# Patient Record
Sex: Female | Born: 1960 | Race: White | Hispanic: No | Marital: Married | Smoking: Never smoker
Health system: Southern US, Community
[De-identification: ages and names within clinical notes are randomized; demographics above are authoritative.]

## PROBLEM LIST (undated history)

## (undated) DIAGNOSIS — R609 Edema, unspecified: Secondary | ICD-10-CM

## (undated) DIAGNOSIS — N189 Chronic kidney disease, unspecified: Secondary | ICD-10-CM

## (undated) DIAGNOSIS — F419 Anxiety disorder, unspecified: Secondary | ICD-10-CM

## (undated) DIAGNOSIS — F329 Major depressive disorder, single episode, unspecified: Secondary | ICD-10-CM

## (undated) DIAGNOSIS — F32A Depression, unspecified: Secondary | ICD-10-CM

## (undated) DIAGNOSIS — R51 Headache: Secondary | ICD-10-CM

## (undated) DIAGNOSIS — R42 Dizziness and giddiness: Secondary | ICD-10-CM

## (undated) DIAGNOSIS — Z9109 Other allergy status, other than to drugs and biological substances: Secondary | ICD-10-CM

## (undated) DIAGNOSIS — I10 Essential (primary) hypertension: Secondary | ICD-10-CM

## (undated) DIAGNOSIS — G4730 Sleep apnea, unspecified: Secondary | ICD-10-CM

## (undated) DIAGNOSIS — R112 Nausea with vomiting, unspecified: Secondary | ICD-10-CM

## (undated) DIAGNOSIS — E039 Hypothyroidism, unspecified: Secondary | ICD-10-CM

## (undated) DIAGNOSIS — K219 Gastro-esophageal reflux disease without esophagitis: Secondary | ICD-10-CM

## (undated) DIAGNOSIS — Z9889 Other specified postprocedural states: Secondary | ICD-10-CM

## (undated) DIAGNOSIS — J40 Bronchitis, not specified as acute or chronic: Secondary | ICD-10-CM

## (undated) DIAGNOSIS — G43909 Migraine, unspecified, not intractable, without status migrainosus: Secondary | ICD-10-CM

## (undated) DIAGNOSIS — E119 Type 2 diabetes mellitus without complications: Secondary | ICD-10-CM

## (undated) DIAGNOSIS — R600 Localized edema: Secondary | ICD-10-CM

## (undated) DIAGNOSIS — E785 Hyperlipidemia, unspecified: Secondary | ICD-10-CM

## (undated) DIAGNOSIS — D649 Anemia, unspecified: Secondary | ICD-10-CM

## (undated) HISTORY — PX: CHOLECYSTECTOMY: SHX55

## (undated) HISTORY — DX: Dizziness and giddiness: R42

## (undated) HISTORY — PX: CARPAL TUNNEL RELEASE: SHX101

## (undated) HISTORY — PX: ABDOMINAL HYSTERECTOMY: SHX81

## (undated) HISTORY — DX: Anemia, unspecified: D64.9

## (undated) HISTORY — PX: ROTATOR CUFF REPAIR: SHX139

## (undated) HISTORY — PX: QUADRICEPS REPAIR: SHX2281

## (undated) HISTORY — DX: Migraine, unspecified, not intractable, without status migrainosus: G43.909

---

## 1997-07-14 ENCOUNTER — Other Ambulatory Visit: Admission: RE | Admit: 1997-07-14 | Discharge: 1997-07-14 | Payer: Self-pay | Admitting: Obstetrics & Gynecology

## 1998-07-04 ENCOUNTER — Other Ambulatory Visit: Admission: RE | Admit: 1998-07-04 | Discharge: 1998-07-04 | Payer: Self-pay | Admitting: Obstetrics & Gynecology

## 1999-08-05 ENCOUNTER — Other Ambulatory Visit: Admission: RE | Admit: 1999-08-05 | Discharge: 1999-08-05 | Payer: Self-pay | Admitting: Obstetrics and Gynecology

## 2000-10-29 ENCOUNTER — Other Ambulatory Visit: Admission: RE | Admit: 2000-10-29 | Discharge: 2000-10-29 | Payer: Self-pay | Admitting: Obstetrics and Gynecology

## 2001-11-02 ENCOUNTER — Other Ambulatory Visit: Admission: RE | Admit: 2001-11-02 | Discharge: 2001-11-02 | Payer: Self-pay | Admitting: Obstetrics and Gynecology

## 2001-12-10 ENCOUNTER — Other Ambulatory Visit: Admission: RE | Admit: 2001-12-10 | Discharge: 2001-12-10 | Payer: Self-pay | Admitting: Obstetrics and Gynecology

## 2002-11-25 ENCOUNTER — Other Ambulatory Visit: Admission: RE | Admit: 2002-11-25 | Discharge: 2002-11-25 | Payer: Self-pay | Admitting: Obstetrics and Gynecology

## 2004-01-16 ENCOUNTER — Other Ambulatory Visit: Admission: RE | Admit: 2004-01-16 | Discharge: 2004-01-16 | Payer: Self-pay | Admitting: Obstetrics and Gynecology

## 2005-03-06 ENCOUNTER — Other Ambulatory Visit: Admission: RE | Admit: 2005-03-06 | Discharge: 2005-03-06 | Payer: Self-pay | Admitting: Obstetrics and Gynecology

## 2009-05-26 HISTORY — PX: THUMB ARTHROSCOPY: SHX2509

## 2011-07-14 ENCOUNTER — Other Ambulatory Visit: Payer: Self-pay | Admitting: Orthopedic Surgery

## 2011-07-28 ENCOUNTER — Encounter (HOSPITAL_COMMUNITY): Payer: Self-pay | Admitting: Pharmacy Technician

## 2011-08-04 ENCOUNTER — Other Ambulatory Visit: Payer: Self-pay

## 2011-08-04 ENCOUNTER — Encounter (HOSPITAL_COMMUNITY): Payer: Self-pay

## 2011-08-04 ENCOUNTER — Encounter (HOSPITAL_COMMUNITY)
Admission: RE | Admit: 2011-08-04 | Discharge: 2011-08-04 | Disposition: A | Discharge status: Home / Self Care | Payer: BC Managed Care – PPO | Source: Ambulatory Visit | Attending: Orthopedic Surgery | Admitting: Orthopedic Surgery

## 2011-08-04 HISTORY — DX: Depression, unspecified: F32.A

## 2011-08-04 HISTORY — DX: Essential (primary) hypertension: I10

## 2011-08-04 HISTORY — DX: Headache: R51

## 2011-08-04 HISTORY — DX: Hyperlipidemia, unspecified: E78.5

## 2011-08-04 HISTORY — DX: Other specified postprocedural states: R11.2

## 2011-08-04 HISTORY — DX: Other specified postprocedural states: Z98.890

## 2011-08-04 HISTORY — DX: Major depressive disorder, single episode, unspecified: F32.9

## 2011-08-04 HISTORY — DX: Chronic kidney disease, unspecified: N18.9

## 2011-08-04 HISTORY — DX: Gastro-esophageal reflux disease without esophagitis: K21.9

## 2011-08-04 HISTORY — DX: Hypothyroidism, unspecified: E03.9

## 2011-08-04 HISTORY — DX: Bronchitis, not specified as acute or chronic: J40

## 2011-08-04 LAB — PROTIME-INR: INR: 0.99 (ref 0.00–1.49)

## 2011-08-04 LAB — SURGICAL PCR SCREEN: MRSA, PCR: NEGATIVE

## 2011-08-04 LAB — URINALYSIS, ROUTINE W REFLEX MICROSCOPIC
Glucose, UA: NEGATIVE mg/dL
Hgb urine dipstick: NEGATIVE
Ketones, ur: NEGATIVE mg/dL
Protein, ur: NEGATIVE mg/dL
pH: 6 (ref 5.0–8.0)

## 2011-08-04 LAB — BASIC METABOLIC PANEL
BUN: 18 mg/dL (ref 6–23)
Calcium: 9.7 mg/dL (ref 8.4–10.5)
Creatinine, Ser: 1.09 mg/dL (ref 0.50–1.10)
GFR calc Af Amer: 66 mL/min — ABNORMAL LOW (ref >90)
GFR calc non Af Amer: 57 mL/min — ABNORMAL LOW (ref >90)

## 2011-08-04 LAB — URINE MICROSCOPIC-ADD ON

## 2011-08-04 LAB — DIFFERENTIAL
Basophils Absolute: 0.1 10*3/uL (ref 0.0–0.1)
Basophils Relative: 1 % (ref 0–1)
Neutro Abs: 3.6 10*3/uL (ref 1.7–7.7)
Neutrophils Relative %: 51 % (ref 43–77)

## 2011-08-04 LAB — CBC
MCHC: 33 g/dL (ref 30.0–36.0)
RDW: 14 % (ref 11.5–15.5)

## 2011-08-04 LAB — TYPE AND SCREEN: ABO/RH(D): A POS

## 2011-08-04 MED ORDER — CHLORHEXIDINE GLUCONATE 4 % EX LIQD: 60.0000 mL | Freq: Once | CUTANEOUS | Status: DC

## 2011-08-04 NOTE — Pre-Procedure Instructions (Signed)
20 Valerie Moore  08/04/2011   Your procedure is scheduled on:  MARCH 18 [facility])  Report to [facility] at 8:45 AM.  Call this number if you have problems the morning of surgery: [phone]   Remember:   Do not eat food:After Midnight.  May have clear liquids: up to 4 Hours before arrival.  Clear liquids include soda, tea, black coffee, apple or grape juice, broth.  Take these medicines the morning of surgery with A SIP OF WATER: ATENOLOL,PAIN PILL AS NEEDED, SYNTHROID,PROTONIX   Do not wear jewelry, make-up or nail polish.  Do not wear lotions, powders, or perfumes. You may wear deodorant.  Do not shave 48 hours prior to surgery.  Do not bring valuables to the hospital.  Contacts, dentures or bridgework may not be worn into surgery.  Leave suitcase in the car. After surgery it may be brought to your room.  For patients admitted to the hospital, checkout time is 11:00 AM the day of discharge.   Patients discharged the day of surgery will not be allowed to drive home.  Name and phone number of your driver: NA  Special Instructions: Incentive Spirometry - Practice and bring it with you on the day of surgery. and CHG Shower Use Special Wash: 1/2 bottle night before surgery and 1/2 bottle morning of surgery.   Please read over the following fact sheets that you were given: Pain Booklet, Blood Transfusion Information, Total Joint Packet, MRSA Information and Surgical Site Infection Prevention

## 2011-08-08 NOTE — Progress Notes (Signed)
Left messages at home and on cell phone for pt to arrive at 0730 for surgery on Monday.

## 2011-08-10 NOTE — H&P (Signed)
HPI: Patient presents with a chief complaint of bilateral knee pain.  She reports that she had a quad tendon rupture in her right knee 20 years ago.  She had a repair followed by several months of physical therapy.  Her right knee has always been her "bad knee.  At this point she has significant pain with grinding and catching in her knees.  She's also had swelling and a recurrent Baker's cyst that has been drained several times and injected with cortisone.  She uses Vicodin occasionally for pain.  She localizes her pain to the anterior aspect of both knees and reports that it wakes her from sleep at night.    All: She is allergic to penicillin and shellfish   ROS: 14 point review of systems form filled out by the patient was reviewed and was negative as it relates to the history of present illness except for: Glasses, hoarseness, cough, depression.  PMH: Hypertension, frequent UTIs, decreased kidney function, sleep apnea, acid reflux, gout, high cholesterol, hypothyroidism.  FHx: Hypertension and arthritis  SocHx:  Denies tobacco.  Drinks occasional alcohol.  She works as an Environmental health practitioner at [employer].  ROS: Patient denies dizziness, nausea, fever, chills, vomiting, shortness of breath, chest pain, loss of appetite, or rash.    PHYSICAL EXAM: Awake, alert, and oriented x3.  Extraocular motion is intact.  No use of accessory respiratory muscles for breathing.   Cardiovascular exam reveals a regular rhythm.  Skin is intact without cuts, scrapes, or abrasions. Well-developed and well-nourished 51 year old female who is alert and oriented in no acute distress.  Feet 9 inches tall, 204 pounds.  Examination of the knees demonstrates a fairly compelling patellofemoral crepitance.  No gross she is nontender to palpation along the joint lines.  She has a 1+ effusion in the right knee and a palpable Baker's cyst.  The ligaments are globally stable.  Right knee range of motion is limited at 5-100.   She is neurovascularly intact.  Imaging/Tests: X-rays taken elsewhere were reviewed in our office today and demonstrate end-stage arthritis in the patellofemoral compartments of both knees.  She has spurring in the lateral compartment of both knees.  Asses: Bilateral end-stage patellofemoral arthritis  Plan:.  We have discussed the options in detail with Valerie Moore and her husband today.  At this point she has exhausted all conservative measures and would like to proceed with right total knee arthroplasty.  All risks and benefits of surgery were discussed.

## 2011-08-11 ENCOUNTER — Encounter (HOSPITAL_COMMUNITY): Payer: Self-pay | Admitting: Anesthesiology

## 2011-08-11 ENCOUNTER — Ambulatory Visit (HOSPITAL_COMMUNITY): Payer: BC Managed Care – PPO | Admitting: Anesthesiology

## 2011-08-11 ENCOUNTER — Encounter (HOSPITAL_COMMUNITY)
Admission: RE | Disposition: A | Discharge status: Home Health | Payer: Self-pay | Source: Ambulatory Visit | Attending: Orthopedic Surgery

## 2011-08-11 ENCOUNTER — Inpatient Hospital Stay (HOSPITAL_COMMUNITY)
Admission: RE | Admit: 2011-08-11 | Discharge: 2011-08-14 | DRG: 209 | Disposition: A | Discharge status: Home Health | Payer: BC Managed Care – PPO | Source: Ambulatory Visit | Attending: Orthopedic Surgery | Admitting: Orthopedic Surgery

## 2011-08-11 ENCOUNTER — Encounter (HOSPITAL_COMMUNITY): Payer: Self-pay | Admitting: *Deleted

## 2011-08-11 DIAGNOSIS — Z01812 Encounter for preprocedural laboratory examination: Secondary | ICD-10-CM

## 2011-08-11 DIAGNOSIS — Z8249 Family history of ischemic heart disease and other diseases of the circulatory system: Secondary | ICD-10-CM

## 2011-08-11 DIAGNOSIS — I10 Essential (primary) hypertension: Secondary | ICD-10-CM | POA: Diagnosis present

## 2011-08-11 DIAGNOSIS — F329 Major depressive disorder, single episode, unspecified: Secondary | ICD-10-CM | POA: Diagnosis present

## 2011-08-11 DIAGNOSIS — K219 Gastro-esophageal reflux disease without esophagitis: Secondary | ICD-10-CM | POA: Diagnosis present

## 2011-08-11 DIAGNOSIS — E039 Hypothyroidism, unspecified: Secondary | ICD-10-CM | POA: Diagnosis present

## 2011-08-11 DIAGNOSIS — M1711 Unilateral primary osteoarthritis, right knee: Secondary | ICD-10-CM

## 2011-08-11 DIAGNOSIS — Z8261 Family history of arthritis: Secondary | ICD-10-CM

## 2011-08-11 DIAGNOSIS — F3289 Other specified depressive episodes: Secondary | ICD-10-CM | POA: Diagnosis present

## 2011-08-11 DIAGNOSIS — Z91013 Allergy to seafood: Secondary | ICD-10-CM

## 2011-08-11 DIAGNOSIS — Z880 Allergy status to penicillin: Secondary | ICD-10-CM

## 2011-08-11 DIAGNOSIS — M1710 Unilateral primary osteoarthritis, unspecified knee: Principal | ICD-10-CM | POA: Diagnosis present

## 2011-08-11 DIAGNOSIS — M109 Gout, unspecified: Secondary | ICD-10-CM | POA: Diagnosis present

## 2011-08-11 HISTORY — PX: TOTAL KNEE ARTHROPLASTY: SHX125

## 2011-08-11 LAB — HCG, SERUM, QUALITATIVE: Preg, Serum: NEGATIVE

## 2011-08-11 SURGERY — ARTHROPLASTY, KNEE, TOTAL
Anesthesia: Regional | Site: Knee | Laterality: Right | Wound class: Clean

## 2011-08-11 MED ORDER — DEXTROSE-NACL 5-0.45 % IV SOLN: INTRAVENOUS | Status: DC

## 2011-08-11 MED ORDER — ACETAMINOPHEN 650 MG RE SUPP: 650.0000 mg | Freq: Four times a day (QID) | RECTAL | Status: DC | PRN

## 2011-08-11 MED ORDER — MENTHOL 3 MG MT LOZG: 1.0000 | LOZENGE | OROMUCOSAL | Status: DC | PRN

## 2011-08-11 MED ORDER — ALLOPURINOL 300 MG PO TABS
600.0000 mg | ORAL_TABLET | Freq: Every day | ORAL | Status: DC
  Administered 2011-08-11 – 2011-08-13 (×3): 600 mg via ORAL
  Filled 2011-08-11 (×4): qty 2

## 2011-08-11 MED ORDER — DULOXETINE HCL 60 MG PO CPEP
90.0000 mg | ORAL_CAPSULE | Freq: Every day | ORAL | Status: DC
  Administered 2011-08-11 – 2011-08-13 (×3): 90 mg via ORAL
  Filled 2011-08-11 (×4): qty 1

## 2011-08-11 MED ORDER — MORPHINE SULFATE 2 MG/ML IJ SOLN: 0.0500 mg/kg | INTRAMUSCULAR | Status: DC | PRN

## 2011-08-11 MED ORDER — LEVOTHYROXINE SODIUM 88 MCG PO TABS
88.0000 ug | ORAL_TABLET | Freq: Every day | ORAL | Status: DC
  Administered 2011-08-11 – 2011-08-13 (×3): 88 ug via ORAL
  Filled 2011-08-11 (×4): qty 1

## 2011-08-11 MED ORDER — EPHEDRINE SULFATE 50 MG/ML IJ SOLN
INTRAMUSCULAR | Status: DC | PRN
  Administered 2011-08-11: 10 mg via INTRAVENOUS

## 2011-08-11 MED ORDER — PROPOFOL 10 MG/ML IV EMUL
INTRAVENOUS | Status: DC | PRN
  Administered 2011-08-11: 120 mg via INTRAVENOUS

## 2011-08-11 MED ORDER — VANCOMYCIN HCL 1000 MG IV SOLR
1000.0000 mg | INTRAVENOUS | Status: DC | PRN
  Administered 2011-08-11: 1000 mg via INTRAVENOUS

## 2011-08-11 MED ORDER — PHENOL 1.4 % MT LIQD: 1.0000 | OROMUCOSAL | Status: DC | PRN

## 2011-08-11 MED ORDER — PANTOPRAZOLE SODIUM 40 MG PO TBEC
40.0000 mg | DELAYED_RELEASE_TABLET | Freq: Two times a day (BID) | ORAL | Status: DC
  Administered 2011-08-11 – 2011-08-14 (×5): 40 mg via ORAL
  Filled 2011-08-11 (×4): qty 1

## 2011-08-11 MED ORDER — POTASSIUM CHLORIDE ER 10 MEQ PO TBCR
20.0000 meq | EXTENDED_RELEASE_TABLET | Freq: Every morning | ORAL | Status: DC
  Administered 2011-08-11 – 2011-08-14 (×4): 20 meq via ORAL
  Filled 2011-08-11 (×4): qty 2

## 2011-08-11 MED ORDER — MAGNESIUM HYDROXIDE 400 MG/5ML PO SUSP: 30.0000 mL | Freq: Every day | ORAL | Status: DC | PRN

## 2011-08-11 MED ORDER — MIDAZOLAM HCL 2 MG/2ML IJ SOLN
1.0000 mg | INTRAMUSCULAR | Status: DC | PRN
  Administered 2011-08-11: 2 mg via INTRAVENOUS

## 2011-08-11 MED ORDER — CEFAZOLIN SODIUM 1-5 GM-% IV SOLN: 1.0000 g | INTRAVENOUS | Status: DC

## 2011-08-11 MED ORDER — CEFAZOLIN SODIUM-DEXTROSE 2-3 GM-% IV SOLR
INTRAVENOUS | Status: AC
  Filled 2011-08-11: qty 50

## 2011-08-11 MED ORDER — ACETAMINOPHEN 10 MG/ML IV SOLN
INTRAVENOUS | Status: DC | PRN
  Administered 2011-08-11: 1000 mg via INTRAVENOUS

## 2011-08-11 MED ORDER — ONDANSETRON HCL 4 MG/2ML IJ SOLN
INTRAMUSCULAR | Status: DC | PRN
  Administered 2011-08-11: 4 mg via INTRAVENOUS

## 2011-08-11 MED ORDER — HYDROMORPHONE HCL PF 1 MG/ML IJ SOLN
INTRAMUSCULAR | Status: AC
  Filled 2011-08-11: qty 1

## 2011-08-11 MED ORDER — METOCLOPRAMIDE HCL 10 MG PO TABS: 5.0000 mg | ORAL_TABLET | Freq: Three times a day (TID) | ORAL | Status: DC | PRN

## 2011-08-11 MED ORDER — SUMATRIPTAN SUCCINATE 50 MG PO TABS
50.0000 mg | ORAL_TABLET | Freq: Once | ORAL | Status: DC
  Filled 2011-08-11: qty 1

## 2011-08-11 MED ORDER — METOCLOPRAMIDE HCL 5 MG/ML IJ SOLN: 5.0000 mg | Freq: Three times a day (TID) | INTRAMUSCULAR | Status: DC | PRN

## 2011-08-11 MED ORDER — WARFARIN - PHARMACIST DOSING INPATIENT: Freq: Every day | Status: DC

## 2011-08-11 MED ORDER — ADULT MULTIVITAMIN W/MINERALS CH
1.0000 | ORAL_TABLET | Freq: Every day | ORAL | Status: DC
  Administered 2011-08-11 – 2011-08-14 (×4): 1 via ORAL
  Filled 2011-08-11 (×4): qty 1

## 2011-08-11 MED ORDER — AMITRIPTYLINE HCL 100 MG PO TABS
100.0000 mg | ORAL_TABLET | Freq: Every day | ORAL | Status: DC
  Administered 2011-08-11 – 2011-08-13 (×3): 100 mg via ORAL
  Filled 2011-08-11 (×4): qty 1

## 2011-08-11 MED ORDER — COLCHICINE 0.6 MG PO TABS
0.6000 mg | ORAL_TABLET | Freq: Every day | ORAL | Status: DC
  Administered 2011-08-11 – 2011-08-13 (×3): 0.6 mg via ORAL
  Filled 2011-08-11 (×4): qty 1

## 2011-08-11 MED ORDER — FENTANYL CITRATE 0.05 MG/ML IJ SOLN
INTRAMUSCULAR | Status: AC
  Filled 2011-08-11: qty 2

## 2011-08-11 MED ORDER — ALUM & MAG HYDROXIDE-SIMETH 200-200-20 MG/5ML PO SUSP: 30.0000 mL | ORAL | Status: DC | PRN

## 2011-08-11 MED ORDER — BISACODYL 10 MG RE SUPP: 10.0000 mg | Freq: Every day | RECTAL | Status: DC | PRN

## 2011-08-11 MED ORDER — LACTATED RINGERS IV SOLN
INTRAVENOUS | Status: DC
  Administered 2011-08-11: 09:00:00 via INTRAVENOUS

## 2011-08-11 MED ORDER — DIPHENHYDRAMINE HCL 12.5 MG/5ML PO ELIX
12.5000 mg | ORAL_SOLUTION | ORAL | Status: DC | PRN
Start: 2011-08-11 — End: 2011-08-14

## 2011-08-11 MED ORDER — HYDROCODONE-ACETAMINOPHEN 5-325 MG PO TABS
1.0000 | ORAL_TABLET | ORAL | Status: DC | PRN
  Administered 2011-08-11 – 2011-08-13 (×3): 2 via ORAL
  Filled 2011-08-11 (×3): qty 2

## 2011-08-11 MED ORDER — ACETAMINOPHEN 10 MG/ML IV SOLN
INTRAVENOUS | Status: AC
  Filled 2011-08-11: qty 100

## 2011-08-11 MED ORDER — FUROSEMIDE 80 MG PO TABS
80.0000 mg | ORAL_TABLET | Freq: Every morning | ORAL | Status: DC
  Administered 2011-08-11 – 2011-08-14 (×4): 80 mg via ORAL
  Filled 2011-08-11 (×4): qty 1

## 2011-08-11 MED ORDER — MODAFINIL 200 MG PO TABS
200.0000 mg | ORAL_TABLET | Freq: Every day | ORAL | Status: DC
  Administered 2011-08-12: 400 mg via ORAL
  Administered 2011-08-13: 200 mg via ORAL
  Filled 2011-08-11: qty 1
  Filled 2011-08-11 (×2): qty 2

## 2011-08-11 MED ORDER — SUFENTANIL CITRATE 50 MCG/ML IV SOLN
INTRAVENOUS | Status: DC | PRN
  Administered 2011-08-11: 20 ug via INTRAVENOUS
  Administered 2011-08-11: 10 ug via INTRAVENOUS
  Administered 2011-08-11: 20 ug via INTRAVENOUS

## 2011-08-11 MED ORDER — CEFUROXIME SODIUM 1.5 G IJ SOLR
INTRAMUSCULAR | Status: DC | PRN
  Administered 2011-08-11: 1.5 g

## 2011-08-11 MED ORDER — SIMVASTATIN 20 MG PO TABS
20.0000 mg | ORAL_TABLET | Freq: Every day | ORAL | Status: DC
  Administered 2011-08-11 – 2011-08-14 (×4): 20 mg via ORAL
  Filled 2011-08-11 (×4): qty 1

## 2011-08-11 MED ORDER — CLONAZEPAM 0.5 MG PO TABS
0.2500 mg | ORAL_TABLET | Freq: Every day | ORAL | Status: DC
  Administered 2011-08-11 – 2011-08-12 (×2): 0.25 mg via ORAL
  Filled 2011-08-11 (×2): qty 1

## 2011-08-11 MED ORDER — ONDANSETRON HCL 4 MG/2ML IJ SOLN: 4.0000 mg | Freq: Once | INTRAMUSCULAR | Status: DC | PRN

## 2011-08-11 MED ORDER — FENTANYL CITRATE 0.05 MG/ML IJ SOLN
50.0000 ug | INTRAMUSCULAR | Status: DC | PRN
  Administered 2011-08-11: 100 ug via INTRAVENOUS

## 2011-08-11 MED ORDER — LACTATED RINGERS IV SOLN
INTRAVENOUS | Status: DC | PRN
  Administered 2011-08-11 (×2): via INTRAVENOUS

## 2011-08-11 MED ORDER — NITROFURANTOIN MONOHYD MACRO 100 MG PO CAPS
100.0000 mg | ORAL_CAPSULE | Freq: Every day | ORAL | Status: DC
  Administered 2011-08-11 – 2011-08-13 (×3): 100 mg via ORAL
  Filled 2011-08-11 (×4): qty 1

## 2011-08-11 MED ORDER — DROPERIDOL 2.5 MG/ML IJ SOLN
INTRAMUSCULAR | Status: DC | PRN
  Administered 2011-08-11: 0.625 mg via INTRAVENOUS

## 2011-08-11 MED ORDER — BUPIVACAINE-EPINEPHRINE PF 0.5-1:200000 % IJ SOLN
INTRAMUSCULAR | Status: DC | PRN
  Administered 2011-08-11: 30 mL

## 2011-08-11 MED ORDER — ACETAMINOPHEN 325 MG PO TABS: 650.0000 mg | ORAL_TABLET | Freq: Four times a day (QID) | ORAL | Status: DC | PRN

## 2011-08-11 MED ORDER — ONDANSETRON HCL 4 MG PO TABS: 4.0000 mg | ORAL_TABLET | Freq: Four times a day (QID) | ORAL | Status: DC | PRN

## 2011-08-11 MED ORDER — ATENOLOL 50 MG PO TABS
50.0000 mg | ORAL_TABLET | Freq: Every day | ORAL | Status: DC
  Administered 2011-08-12 – 2011-08-14 (×2): 50 mg via ORAL
  Filled 2011-08-11 (×4): qty 1

## 2011-08-11 MED ORDER — KCL IN DEXTROSE-NACL 20-5-0.45 MEQ/L-%-% IV SOLN
INTRAVENOUS | Status: AC
  Filled 2011-08-11: qty 1000

## 2011-08-11 MED ORDER — FLEET ENEMA 7-19 GM/118ML RE ENEM: 1.0000 | ENEMA | Freq: Once | RECTAL | Status: AC | PRN

## 2011-08-11 MED ORDER — WARFARIN VIDEO: Freq: Once | Status: DC

## 2011-08-11 MED ORDER — METHOCARBAMOL 100 MG/ML IJ SOLN
500.0000 mg | Freq: Four times a day (QID) | INTRAVENOUS | Status: DC | PRN
  Administered 2011-08-11: 500 mg via INTRAVENOUS
  Filled 2011-08-11: qty 5

## 2011-08-11 MED ORDER — OXYCODONE-ACETAMINOPHEN 5-325 MG PO TABS
1.0000 | ORAL_TABLET | ORAL | Status: DC | PRN
Start: 2011-08-11 — End: 2011-08-14
  Administered 2011-08-12 (×4): 2 via ORAL
  Administered 2011-08-12: 1 via ORAL
  Administered 2011-08-13 – 2011-08-14 (×3): 2 via ORAL
  Filled 2011-08-11 (×2): qty 2
  Filled 2011-08-11: qty 1
  Filled 2011-08-11 (×5): qty 2

## 2011-08-11 MED ORDER — WARFARIN SODIUM 7.5 MG PO TABS
7.5000 mg | ORAL_TABLET | Freq: Once | ORAL | Status: AC
  Administered 2011-08-11: 7.5 mg via ORAL
  Filled 2011-08-11: qty 1

## 2011-08-11 MED ORDER — HYDROMORPHONE HCL PF 1 MG/ML IJ SOLN: 0.2500 mg | INTRAMUSCULAR | Status: DC | PRN

## 2011-08-11 MED ORDER — METHOCARBAMOL 500 MG PO TABS
500.0000 mg | ORAL_TABLET | Freq: Four times a day (QID) | ORAL | Status: DC | PRN
  Administered 2011-08-11 – 2011-08-14 (×6): 500 mg via ORAL
  Filled 2011-08-11 (×7): qty 1

## 2011-08-11 MED ORDER — VANCOMYCIN HCL IN DEXTROSE 1-5 GM/200ML-% IV SOLN
INTRAVENOUS | Status: AC
  Filled 2011-08-11: qty 200

## 2011-08-11 MED ORDER — MIDAZOLAM HCL 2 MG/2ML IJ SOLN
INTRAMUSCULAR | Status: AC
  Filled 2011-08-11: qty 2

## 2011-08-11 MED ORDER — LIDOCAINE HCL (CARDIAC) 20 MG/ML IV SOLN
INTRAVENOUS | Status: DC | PRN
  Administered 2011-08-11: 100 mg via INTRAVENOUS

## 2011-08-11 MED ORDER — CEFAZOLIN SODIUM-DEXTROSE 2-3 GM-% IV SOLR: 2.0000 g | INTRAVENOUS | Status: DC

## 2011-08-11 MED ORDER — HYDROMORPHONE HCL PF 1 MG/ML IJ SOLN
0.2500 mg | INTRAMUSCULAR | Status: DC | PRN
Start: 2011-08-11 — End: 2011-08-11
  Administered 2011-08-11 (×2): 0.5 mg via INTRAVENOUS

## 2011-08-11 MED ORDER — HYDROMORPHONE HCL PF 1 MG/ML IJ SOLN
0.5000 mg | INTRAMUSCULAR | Status: DC | PRN
  Administered 2011-08-11 – 2011-08-13 (×14): 1 mg via INTRAVENOUS
  Filled 2011-08-11 (×13): qty 1

## 2011-08-11 MED ORDER — LIDOCAINE HCL 4 % MT SOLN
OROMUCOSAL | Status: DC | PRN
  Administered 2011-08-11: 4 mL via TOPICAL

## 2011-08-11 MED ORDER — FENTANYL CITRATE 0.05 MG/ML IJ SOLN
INTRAMUSCULAR | Status: DC | PRN
  Administered 2011-08-11: 100 ug via INTRAVENOUS

## 2011-08-11 MED ORDER — ZOLPIDEM TARTRATE 5 MG PO TABS: 5.0000 mg | ORAL_TABLET | Freq: Every evening | ORAL | Status: DC | PRN

## 2011-08-11 MED ORDER — SODIUM CHLORIDE 0.9 % IR SOLN
Status: DC | PRN
  Administered 2011-08-11: 3000 mL

## 2011-08-11 MED ORDER — DEXTROSE 5 % IV SOLN
INTRAVENOUS | Status: DC | PRN
  Administered 2011-08-11: 10:00:00 via INTRAVENOUS

## 2011-08-11 MED ORDER — ONDANSETRON HCL 4 MG/2ML IJ SOLN: 4.0000 mg | Freq: Four times a day (QID) | INTRAMUSCULAR | Status: DC | PRN

## 2011-08-11 MED ORDER — COUMADIN BOOK
Freq: Once | Status: DC
  Filled 2011-08-11: qty 1

## 2011-08-11 MED ORDER — MIDAZOLAM HCL 5 MG/5ML IJ SOLN
INTRAMUSCULAR | Status: DC | PRN
  Administered 2011-08-11 (×2): 2 mg via INTRAVENOUS

## 2011-08-11 MED ORDER — LEVALBUTEROL HCL 1.25 MG/0.5ML IN NEBU
1.2500 mg | INHALATION_SOLUTION | RESPIRATORY_TRACT | Status: DC | PRN
  Filled 2011-08-11: qty 0.5

## 2011-08-11 MED ORDER — KCL IN DEXTROSE-NACL 20-5-0.45 MEQ/L-%-% IV SOLN
INTRAVENOUS | Status: DC
  Administered 2011-08-11 – 2011-08-12 (×4): via INTRAVENOUS
  Filled 2011-08-11 (×11): qty 1000

## 2011-08-11 MED ORDER — ROCURONIUM BROMIDE 100 MG/10ML IV SOLN
INTRAVENOUS | Status: DC | PRN
  Administered 2011-08-11: 50 mg via INTRAVENOUS

## 2011-08-11 SURGICAL SUPPLY — 59 items
BANDAGE ESMARK 6X9 LF (GAUZE/BANDAGES/DRESSINGS) ×1 IMPLANT
BLADE SAG 18X100X1.27 (BLADE) ×2 IMPLANT
BLADE SAW SGTL 13X75X1.27 (BLADE) ×2 IMPLANT
BLADE SURG ROTATE 9660 (MISCELLANEOUS) IMPLANT
BNDG ELASTIC 6X10 VLCR STRL LF (GAUZE/BANDAGES/DRESSINGS) ×2 IMPLANT
BNDG ESMARK 6X9 LF (GAUZE/BANDAGES/DRESSINGS) ×2
BOWL SMART MIX CTS (DISPOSABLE) ×2 IMPLANT
CEMENT BONE DEPUY (Cement) IMPLANT
CEMENT HV SMART SET (Cement) ×4 IMPLANT
CLOTH BEACON ORANGE TIMEOUT ST (SAFETY) ×2 IMPLANT
COVER BACK TABLE 24X17X13 BIG (DRAPES) IMPLANT
COVER SURGICAL LIGHT HANDLE (MISCELLANEOUS) ×4 IMPLANT
CUFF TOURNIQUET SINGLE 34IN LL (TOURNIQUET CUFF) ×2 IMPLANT
CUFF TOURNIQUET SINGLE 44IN (TOURNIQUET CUFF) IMPLANT
DRAPE EXTREMITY T 121X128X90 (DRAPE) ×2 IMPLANT
DRAPE INCISE IOBAN 66X45 STRL (DRAPES) ×2 IMPLANT
DRAPE U-SHAPE 47X51 STRL (DRAPES) ×2 IMPLANT
DURAPREP 26ML APPLICATOR (WOUND CARE) ×2 IMPLANT
ELECT REM PT RETURN 9FT ADLT (ELECTROSURGICAL) ×2
ELECTRODE REM PT RTRN 9FT ADLT (ELECTROSURGICAL) ×1 IMPLANT
EVACUATOR 1/8 PVC DRAIN (DRAIN) ×2 IMPLANT
GAUZE XEROFORM 1X8 LF (GAUZE/BANDAGES/DRESSINGS) ×2 IMPLANT
GLOVE BIO SURGEON STRL SZ 6.5 (GLOVE) ×2 IMPLANT
GLOVE BIO SURGEON STRL SZ7 (GLOVE) ×2 IMPLANT
GLOVE BIO SURGEON STRL SZ7.5 (GLOVE) ×2 IMPLANT
GLOVE BIOGEL PI IND STRL 6.5 (GLOVE) ×1 IMPLANT
GLOVE BIOGEL PI IND STRL 7.0 (GLOVE) ×1 IMPLANT
GLOVE BIOGEL PI IND STRL 8 (GLOVE) ×1 IMPLANT
GLOVE BIOGEL PI INDICATOR 6.5 (GLOVE) ×1
GLOVE BIOGEL PI INDICATOR 7.0 (GLOVE) ×1
GLOVE BIOGEL PI INDICATOR 8 (GLOVE) ×1
GLOVE SURG SS PI 6.5 STRL IVOR (GLOVE) ×2 IMPLANT
GOWN PREVENTION PLUS XLARGE (GOWN DISPOSABLE) ×2 IMPLANT
GOWN STRL NON-REIN LRG LVL3 (GOWN DISPOSABLE) ×4 IMPLANT
HANDPIECE INTERPULSE COAX TIP (DISPOSABLE) ×1
HOOD PEEL AWAY FACE SHEILD DIS (HOOD) ×4 IMPLANT
KIT BASIN OR (CUSTOM PROCEDURE TRAY) ×2 IMPLANT
KIT ROOM TURNOVER OR (KITS) ×2 IMPLANT
MANIFOLD NEPTUNE II (INSTRUMENTS) ×2 IMPLANT
NS IRRIG 1000ML POUR BTL (IV SOLUTION) IMPLANT
PACK TOTAL JOINT (CUSTOM PROCEDURE TRAY) ×2 IMPLANT
PAD ARMBOARD 7.5X6 YLW CONV (MISCELLANEOUS) ×4 IMPLANT
PADDING CAST COTTON 6X4 STRL (CAST SUPPLIES) ×2 IMPLANT
SET HNDPC FAN SPRY TIP SCT (DISPOSABLE) ×1 IMPLANT
SLEEVE SURGEON STRL (DRAPES) ×2 IMPLANT
SPONGE GAUZE 4X4 12PLY (GAUZE/BANDAGES/DRESSINGS) ×2 IMPLANT
STAPLER VISISTAT 35W (STAPLE) ×2 IMPLANT
SUCTION FRAZIER TIP 10 FR DISP (SUCTIONS) ×2 IMPLANT
SURGIFLO TRUKIT (HEMOSTASIS) IMPLANT
SUT VIC AB 0 CTX 36 (SUTURE) ×1
SUT VIC AB 0 CTX36XBRD ANTBCTR (SUTURE) ×1 IMPLANT
SUT VIC AB 1 CTX 36 (SUTURE) ×1
SUT VIC AB 1 CTX36XBRD ANBCTR (SUTURE) ×1 IMPLANT
SUT VIC AB 2-0 CT1 27 (SUTURE) ×1
SUT VIC AB 2-0 CT1 TAPERPNT 27 (SUTURE) ×1 IMPLANT
TOWEL OR 17X24 6PK STRL BLUE (TOWEL DISPOSABLE) ×2 IMPLANT
TOWEL OR 17X26 10 PK STRL BLUE (TOWEL DISPOSABLE) ×2 IMPLANT
TRAY FOLEY CATH 14FR (SET/KITS/TRAYS/PACK) ×2 IMPLANT
WATER STERILE IRR 1000ML POUR (IV SOLUTION) ×4 IMPLANT

## 2011-08-11 NOTE — Anesthesia Procedure Notes (Addendum)
Anesthesia Regional Block:  Femoral nerve block  Pre-Anesthetic Checklist: ,, timeout performed, Correct Patient, Correct Site, Correct Laterality, Correct Procedure,, site marked, risks and benefits discussed, Surgical consent, Pre-op evaluation,  At surgeon's request  Laterality: Right  Prep: chloraprep       Needles:  Injection technique: Single-shot      Needle Gauge: 22 and 22 G    Additional Needles:  Procedures: nerve stimulator Femoral nerve block  Nerve Stimulator or Paresthesia:  Response: 0.4 mA,   Additional Responses:   Narrative:  Start time: 08/11/2011 9:10 AM End time: 08/11/2011 9:20 AM Injection made incrementally with aspirations every 5 mL.  Performed by: Personally  Anesthesiologist: Arta Bruce MD  Additional Notes: A functioning IV was confirmed and monitors were applied.  Sterile prep and drape, hand hygiene and sterile gloves were used.  Negative aspiration prior to incremental administration of local anesthetic using the 22 ga needle. The patient tolerated the procedure well.   Femoral nerve block Performed by: Rossie Muskrat L    Procedure Name: Intubation Date/Time: 08/11/2011 9:40 AM Performed by: Rossie Muskrat L Pre-anesthesia Checklist: Patient identified, Timeout performed, Emergency Drugs available, Suction available and Patient being monitored Patient Re-evaluated:Patient Re-evaluated prior to inductionOxygen Delivery Method: Circle system utilized Preoxygenation: Pre-oxygenation with 100% oxygen Intubation Type: IV induction Ventilation: Mask ventilation without difficulty Laryngoscope Size: Mac and 3 Grade View: Grade I Tube type: Oral Tube size: 7.5 mm Number of attempts: 1 Airway Equipment and Method: Stylet Placement Confirmation: ETT inserted through vocal cords under direct vision,  breath sounds checked- equal and bilateral and positive ETCO2 Secured at: 20 cm Tube secured with: Tape Dental Injury: Teeth and  Oropharynx as per pre-operative assessment

## 2011-08-11 NOTE — Anesthesia Postprocedure Evaluation (Signed)
Anesthesia Post Note  Patient: Valerie Moore  Procedure(s) Performed: Procedure(s) (LRB): TOTAL KNEE ARTHROPLASTY (Right)  Anesthesia type: general  Patient location: PACU  Post pain: Pain level controlled  Post assessment: Patient's Cardiovascular Status Stable  Last Vitals:  Filed Vitals:   08/11/11 1145  BP:   Pulse: 71  Temp:   Resp: 12    Post vital signs: Reviewed and stable  Level of consciousness: sedated  Complications: No apparent anesthesia complications

## 2011-08-11 NOTE — Progress Notes (Signed)
ANTICOAGULATION CONSULT NOTE - Initial Consult  Pharmacy Consult for Coumadin Indication: VTE prophylaxis  Allergies  Allergen Reactions  . Penicillins   . Shellfish Allergy     Patient Measurements:   Weight 93.85 kg  Vital Signs: Temp: 98.6 F (37 C) (03/18 1238) Temp src: Oral (03/18 1238) BP: 118/73 mmHg (03/18 1238) Pulse Rate: 76  (03/18 1238)  Labs: No results found for this basename: HGB:2,HCT:3,PLT:3,APTT:3,LABPROT:3,INR:3,HEPARINUNFRC:3,CREATININE:3,CKTOTAL:3,CKMB:3,TROPONINI:3 in the last 72 hours CrCl is unknown because there is no height on file for the current visit.  Medical History: Past Medical History  Diagnosis Date  . Gout   . Hypertension   . Hyperlipemia   . Hypothyroidism   . PONV (postoperative nausea and vomiting)   . Bronchitis   . Depression   . GERD (gastroesophageal reflux disease)   . Headache   . Chronic kidney disease     no NSIADS/anitinflammatory    Medications:  Prescriptions prior to admission  Medication Sig Dispense Refill  . allopurinol (ZYLOPRIM) 300 MG tablet Take 600 mg by mouth at bedtime.      Marland Kitchen amitriptyline (ELAVIL) 100 MG tablet Take 100 mg by mouth at bedtime.      Marland Kitchen atenolol (TENORMIN) 50 MG tablet Take 50 mg by mouth every morning.      Marland Kitchen atorvastatin (LIPITOR) 20 MG tablet Take 20 mg by mouth every morning.      . clonazePAM (KLONOPIN) 0.5 MG tablet Take 0.25 mg by mouth at bedtime as needed. For sleep      . colchicine 0.6 MG tablet Take 0.6 mg by mouth at bedtime.      . DULoxetine (CYMBALTA) 30 MG capsule Take 90 mg by mouth at bedtime.      . furosemide (LASIX) 80 MG tablet Take 80 mg by mouth every morning.      Marland Kitchen HYDROcodone-acetaminophen (NORCO) 5-325 MG per tablet Take 2-3 tablets by mouth 2 (two) times daily as needed. For back and knee pain      . levothyroxine (SYNTHROID, LEVOTHROID) 88 MCG tablet Take 88 mcg by mouth at bedtime.      . modafinil (PROVIGIL) 200 MG tablet Take 200-400 mg by mouth  daily.      . Multiple Vitamin (MULITIVITAMIN WITH MINERALS) TABS Take 1 tablet by mouth daily.      . nitrofurantoin, macrocrystal-monohydrate, (MACROBID) 100 MG capsule Take 100 mg by mouth daily as needed. After intercourse if pt feels an onset of infection (per pt) and/or once daily for a course of 7days      . pantoprazole (PROTONIX) 40 MG tablet Take 40 mg by mouth 2 (two) times daily.      . potassium chloride (K-DUR) 10 MEQ tablet Take 20 mEq by mouth every morning.      Marland Kitchen VITAMIN B1-B12 IJ Inject 1,000 mcg as directed every 30 (thirty) days. On or around the 16 th of each month      . levalbuterol (XOPENEX) 1.25 MG/0.5ML nebulizer solution Take 1 ampule by nebulization every 4 (four) hours as needed. For shortness of breath/wheezing      . rizatriptan (MAXALT) 5 MG tablet Take 5-10 mg by mouth 2 (two) times daily as needed. For migraine headaches        Assessment:  51 yo lady to start coumadin post op TKA.   Goal of Therapy:  INR 2-3   Plan:  Coumadin 7.5 mg today Check daily protime/ INR.  Talbert Cage Poteet 08/11/2011,12:59 PM

## 2011-08-11 NOTE — Progress Notes (Signed)
Orthopedic Tech Progress Note Patient Details:  Valerie Moore 01-13-1961 [id number] Trapeze bar     Cammer, Mickie Bail 08/11/2011, 4:04 PM

## 2011-08-11 NOTE — Interval H&P Note (Signed)
History and Physical Interval Note:  08/11/2011 9:00 AM  Valerie Moore  has presented today for surgery, with the diagnosis of RIGHT KNEE DEGENERATIVE JOINT DISEASE  The various methods of treatment have been discussed with the patient and family. After consideration of risks, benefits and other options for treatment, the patient has consented to  Procedure(s) (LRB): TOTAL KNEE ARTHROPLASTY (Right) as a surgical intervention .  The patients' history has been reviewed, patient examined, no change in status, stable for surgery.  I have reviewed the patients' chart and labs.  Questions were answered to the patient's satisfaction.     Nestor Lewandowsky

## 2011-08-11 NOTE — Anesthesia Postprocedure Evaluation (Signed)
Anesthesia Post Note  Patient: Valerie Moore  Procedure(s) Performed: Procedure(s) (LRB): TOTAL KNEE ARTHROPLASTY (Right)  Anesthesia type: general  Patient location: PACU  Post pain: Pain level controlled  Post assessment: Patient's Cardiovascular Status Stable  Last Vitals:  Filed Vitals:   08/11/11 1145  BP:   Pulse: 71  Temp:   Resp: 12    Post vital signs: Reviewed and stable  Level of consciousness: sedated  Complications: No apparent anesthesia complications  

## 2011-08-11 NOTE — Anesthesia Preprocedure Evaluation (Signed)
Anesthesia Evaluation  Patient identified by MRN, date of birth, ID band Patient awake    Reviewed: Allergy & Precautions, H&P , NPO status , Patient's Chart, lab work & pertinent test results  History of Anesthesia Complications (+) PONV  Airway Mallampati: I TM Distance: >3 FB Neck ROM: Full    Dental   Pulmonary          Cardiovascular hypertension, Pt. on medications     Neuro/Psych    GI/Hepatic GERD-  Medicated and Controlled,  Endo/Other    Renal/GU      Musculoskeletal   Abdominal   Peds  Hematology   Anesthesia Other Findings   Reproductive/Obstetrics                           Anesthesia Physical Anesthesia Plan  ASA: II  Anesthesia Plan: General   Post-op Pain Management: MAC Combined w/ Regional for Post-op pain   Induction: Intravenous  Airway Management Planned: Oral ETT  Additional Equipment:   Intra-op Plan:   Post-operative Plan: Extubation in OR  Informed Consent: I have reviewed the patients History and Physical, chart, labs and discussed the procedure including the risks, benefits and alternatives for the proposed anesthesia with the patient or authorized representative who has indicated his/her understanding and acceptance.     Plan Discussed with: CRNA and Surgeon  Anesthesia Plan Comments:         Anesthesia Quick Evaluation

## 2011-08-11 NOTE — Op Note (Signed)
PATIENT ID:      Valerie Moore  MRN:     [id number] DOB/AGE:    December 06, 1960 / 51 y.o.       OPERATIVE REPORT    DATE OF PROCEDURE:  08/11/2011       PREOPERATIVE DIAGNOSIS:   RIGHT KNEE DEGENERATIVE JOINT DISEASE      There is no height or weight on file to calculate BMI.                                                        POSTOPERATIVE DIAGNOSIS:   RIGHT KNEE DEGENERATIVE JOINT DISEASE                                                                      PROCEDURE:  Procedure(s): TOTAL KNEE ARTHROPLASTY Using Depuy Sigma RP implants #3R Femur, #3Tibia, 10mm sigma RP bearing, 41 Patella     SURGEON: Nakyah Erdmann J    ASSISTANT:   Shirl Harris PA-C   (Present and scrubbed throughout the case, critical for assistance with exposure, retraction, instrumentation, and closure.)         ANESTHESIA: MAC combined with regional for post-op pain   DRAINS: foley, 2 medium hemovac in knee   TOURNIQUET TIME:    COMPLICATIONS:  None     SPECIMENS: None   INDICATIONS FOR PROCEDURE: The patient has  RIGHT KNEE DEGENERATIVE JOINT DISEASE, PF deformities, XR shows bone on bone arthritis. Patient has failed all conservative measures including anti-inflammatory medicines, narcotics, attempts at  exercise and weight loss, cortisone injections and viscosupplementation.  Risks and benefits of surgery have been discussed, questions answered.   DESCRIPTION OF PROCEDURE: The patient identified by armband, received  right femoral nerve block and IV antibiotics, in the holding area at [facility]. Patient taken to the operating room, appropriate anesthetic  monitors were attached General endotracheal anesthesia induced with  the patient in supine position, Foley catheter was inserted. Tourniquet  applied high to the operative thigh. Lateral post and foot positioner  applied to the table, the lower extremity was then prepped and draped  in usual sterile fashion from the ankle to the tourniquet.  Time-out procedure was performed. The limb was wrapped with an Esmarch bandage and the tourniquet inflated to 350 mmHg. We began the operation by making the anterior midline incision starting at  handbreadth above the patella going over the patella 1 cm medial to and  4 cm distal to the tibial tubercle. Small bleeders in the skin and the  subcutaneous tissue identified and cauterized. Transverse retinaculum was incised and reflected medially and a medial parapatellar arthrotomy was accomplished. the patella was everted and theprepatellar fat pad resected. The superficial medial collateral  ligament was then elevated from anterior to posterior along the proximal  flare of the tibia and anterior half of the menisci resected. The knee was hyperflexed exposing bone on bone arthritis. Peripheral and notch osteophytes as well as the cruciate ligaments were then resected. We continued to  work our way around posteriorly along the proximal tibia, and externally  rotated the tibia subluxing it out from underneath the femur. A McHale  retractor was placed through the notch and a lateral Hohmann retractor  placed, and we then drilled through the proximal tibia in line with the  axis of the tibia followed by an intramedullary guide rod and 2-degree  posterior slope cutting guide. The tibial cutting guide was pinned into place  allowing resection of 8 mm of bone medially and about 8 mm of bone  laterally because of her neutral deformity. Satisfied with the tibial resection, we then  entered the distal femur 2 mm anterior to the PCL origin with the  intramedullary guide rod and applied the distal femoral cutting guide  set at 11mm, with 5 degrees of valgus. This was pinned along the  epicondylar axis. At this point, the distal femoral cut was accomplished without difficulty. We then sized for a #3R femoral component and pinned the guide in 0 degrees of external rotation.The chamfer cutting guide was pinned into  place. The anterior, posterior, and chamfer cuts were accomplished without difficulty followed by  the Sigma RP box cutting guide and the box cut. We also removed posterior osteophytes from the posterior femoral condyles. At this  time, the knee was brought into full extension. We checked our  extension and flexion gaps and found them symmetric at 10mm.  The patella thickness measured at 20 mm. We felt a 41 patella would  fit. We set the cutting guide at 15 and removed the posterior 9.5-10 mm  of the patella sized for 41 button and drilled the lollipop. The knee  was then once again hyperflexed exposing the proximal tibia. We sized for a #3 tibial base plate, applied the smokestack and the conical reamer followed by the the Delta fin keel punch. We then hammered into place the Sigma RP trial femoral component, inserted a 10-mm trial bearing, trial patellar button, and took the knee through range of motion from 0-130 degrees. No thumb pressure was required for patellar  tracking. At this point, all trial components were removed, a double batch of DePuy HV cement with 1500 mg of Zinacef was mixed and applied to all bony metallic mating surfaces except for the posterior condyles of the femur itself. In order, we  hammered into place the tibial tray and removed excess cement, the femoral component and removed excess cement, a 10-mm Sigma RP bearing  was inserted, and the knee brought to full extension with compression.  The patellar button was clamped into place, and excess cement  removed. While the cement cured the wound was irrigated out with normal saline solution pulse lavage, and medium Hemovac drains were placed from an anterolateral  approach. Ligament stability and patellar tracking were checked and found to be excellent. The parapatellar arthrotomy was closed with  running #1 Vicryl suture. The subcutaneous tissue with 0 and 2-0 undyed  Vicryl suture, and the skin with skin staples. A dressing  of Xeroform,  4 x 4, dressing sponges, Webril, and Ace wrap applied. The patient  awakened, extubated, and taken to recovery room without difficulty.   Yailin Biederman J 08/11/2011, 10:58 AM

## 2011-08-11 NOTE — Transfer of Care (Signed)
Immediate Anesthesia Transfer of Care Note  Patient: Valerie Moore  Procedure(s) Performed: Procedure(s) (LRB): TOTAL KNEE ARTHROPLASTY (Right)  Patient Location: PACU  Anesthesia Type: General  Level of Consciousness: awake and alert   Airway & Oxygen Therapy: Patient Spontanous Breathing and Patient connected to nasal cannula oxygen  Post-op Assessment: Report given to PACU RN, Post -op Vital signs reviewed and stable and Patient moving all extremities  Post vital signs: Reviewed and stable  Complications: No apparent anesthesia complications

## 2011-08-11 NOTE — Progress Notes (Signed)
Orthopedic Tech Progress Note Patient Details:  Valerie Moore [birth year] [id number]  CPM Right Knee CPM Right Knee: On Right Knee Flexion (Degrees): 30  Right Knee Extension (Degrees): 0    Jo Cerone T 08/11/2011, 11:40 AM

## 2011-08-12 ENCOUNTER — Encounter (HOSPITAL_COMMUNITY): Payer: Self-pay | Admitting: Orthopedic Surgery

## 2011-08-12 LAB — BASIC METABOLIC PANEL
Calcium: 8.9 mg/dL (ref 8.4–10.5)
GFR calc Af Amer: 77 mL/min — ABNORMAL LOW (ref >90)
GFR calc non Af Amer: 67 mL/min — ABNORMAL LOW (ref >90)
Glucose, Bld: 125 mg/dL — ABNORMAL HIGH (ref 70–99)
Sodium: 137 mEq/L (ref 135–145)

## 2011-08-12 LAB — CBC
Hemoglobin: 10.6 g/dL — ABNORMAL LOW (ref 12.0–15.0)
MCH: 32.2 pg (ref 26.0–34.0)
MCHC: 33.5 g/dL (ref 30.0–36.0)
RDW: 13.9 % (ref 11.5–15.5)

## 2011-08-12 LAB — PROTIME-INR: INR: 1.08 (ref 0.00–1.49)

## 2011-08-12 MED ORDER — WARFARIN SODIUM 7.5 MG PO TABS
7.5000 mg | ORAL_TABLET | Freq: Once | ORAL | Status: DC
  Filled 2011-08-12: qty 1

## 2011-08-12 MED ORDER — WARFARIN SODIUM 6 MG PO TABS
6.0000 mg | ORAL_TABLET | Freq: Once | ORAL | Status: AC
  Administered 2011-08-12: 6 mg via ORAL
  Filled 2011-08-12: qty 1

## 2011-08-12 NOTE — Progress Notes (Signed)
Physical Therapy Evaluation  Past Medical History  Diagnosis Date  . Gout   . Hypertension   . Hyperlipemia   . Hypothyroidism   . PONV (postoperative nausea and vomiting)   . Bronchitis   . Depression   . GERD (gastroesophageal reflux disease)   . Headache   . Chronic kidney disease     no NSIADS/anitinflammatory   Past Surgical History  Procedure Date  . Quadriceps repair     lft  . Cesarean section   . Carpal tunnel release     rt  . Cholecystectomy   . Rotator cuff repair     bilateral  . Total knee arthroplasty 08/11/2011    Procedure: TOTAL KNEE ARTHROPLASTY;  Surgeon: Nestor Lewandowsky, MD;  Location: [facility] OR;  Service: Orthopedics;  Laterality: Right;  DEPUY/ SIGMA     08/12/11 0900  PT Visit Information  Last PT Received On 08/12/11  Patient Stated Goals  Goal #1 Walking and getting up from chair.    Precautions  Precautions Fall  Restrictions  Weight Bearing Restrictions Yes  RLE Weight Bearing WBAT  Home Living  Lives With Spouse  Receives Help From Family  Type of Home House  Home Layout One level (2 steps between bedroom and living area)  Home Access Stairs to enter;Ramped entrance  Entrance Stairs-Number of Steps at back has ramp and then single step  Bathroom Shower/Tub Tub/shower unit;Curtain  Firefighter Handicapped height  Bathroom Accessibility Yes  How Accessible Accessible via walker  Home Adaptive Equipment Wheelchair - manual;Walker - rolling;Straight cane;Shower chair with back;Tub transfer bench;Bedside commode/3-in-1  Prior Function  Level of Independence Independent with basic ADLs;Independent with homemaking with ambulation;Independent with gait;Independent with transfers  Able to Take Stairs? 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  Cues for sequencing and encouragement.    Sitting - Scoot to [place] of Bed 4: Min assist  Sitting - Scoot to Edge of Bed Details (indicate cue type and reason) A with R LE only.    Transfers  Transfers Yes  Sit to Stand 4: Min assist;With upper extremity assist;From bed  Sit to Stand Details (indicate cue type and reason) cues for UE use, LE positioning  Stand to Sit 4: Min assist;With upper extremity assist;With armrests;To chair/3-in-1  Stand to Sit Details cues for positioning of R LE, use of armrests, control descent  Ambulation/Gait  Ambulation/Gait Yes  Ambulation/Gait Assistance 4: Min assist  Ambulation/Gait Assistance Details (indicate cue type and reason) cues for RW use, sequencing, upright posture.    Ambulation Distance (Feet) 60 Feet  Assistive device Rolling walker  Gait Pattern Step-to pattern;Decreased step length - left;Decreased stance time - right;Trunk flexed  Stairs No  Wheelchair Mobility  Wheelchair Mobility No  Posture/Postural Control  Posture/Postural Control No significant limitations  Balance  Balance Assessed No  RLE Assessment  RLE Assessment X  RLE AROM (degrees)  RLE Overall AROM Comments AAROM ~10- 30 degrees  LLE Assessment  LLE Assessment WFL  Exercises  Exercises Total Joint  Total Joint Exercises  Ankle Circles/Pumps AROM;Both;10 reps  Quad Sets AROM;Both;10 reps  Long [address] [place];Right;10 reps  Knee Flexion AAROM;Right;10 reps  PT - End of Session  Equipment Utilized During Treatment Gait belt  Activity  Tolerance Patient limited by pain  Patient left in chair;with call bell in reach;with family/visitor present  Nurse Communication Mobility status for transfers;Mobility status for ambulation  CPM Right Knee  CPM Right Knee Off  General  Behavior During Session [facility] for tasks performed  Cognition [facility] for tasks performed  PT Assessment  Clinical Impression Statement pt presents s/p TKA.  pt limited by pain tolerance.  pt  ed on importance of ROM.    PT Recommendation/Assessment Patient will need skilled PT in the acute care venue  PT Problem List Decreased strength;Decreased range of motion;Decreased activity tolerance;Decreased balance;Decreased mobility;Decreased coordination;Decreased knowledge of use of DME;Pain  Barriers to Discharge None  PT Therapy Diagnosis  Abnormality of gait;Acute pain  PT Plan  PT Frequency 7X/week  PT Treatment/Interventions DME instruction;Gait training;Stair training;Functional mobility training;Therapeutic activities;Therapeutic exercise;Balance training;Patient/family education  PT Recommendation  Recommendations for Other Services OT consult  Follow Up Recommendations Home health PT;Supervision - Intermittent  Equipment Recommended None recommended by PT  Individuals Consulted  Consulted and Agree with Results and Recommendations Patient;Family member/caregiver  Family Member Consulted Father  Acute Rehab PT Goals  PT Goal Formulation With patient  Time For Goal Achievement 7 days  Pt will go Supine/Side to Sit with modified independence  PT Goal: Supine/Side to Sit - Progress Goal set today  Pt will go Sit to Supine/Side with modified independence  PT Goal: Sit to Supine/Side - Progress Goal set today  Pt will go Sit to Stand with modified independence;with upper extremity assist  PT Goal: Sit to Stand - Progress Goal set today  Pt will go Stand to Sit with modified independence;with upper extremity assist  PT Goal: Stand to Sit - Progress Goal set today  Pt will Ambulate >150 feet;with modified independence;with rolling walker  PT Goal: Ambulate - Progress Goal set today  Pt will Go Up / Down Stairs 1-2 stairs;with min assist;with rolling walker  PT Goal: Up/Down Stairs - Progress Goal set today  Pt will Perform Home Exercise Program Independently  PT Goal: Perform Home Exercise Program - Progress Goal set today    Mack Hook, PT [phone]

## 2011-08-12 NOTE — Progress Notes (Signed)
Physical Therapy Note   08/12/11 1500  PT Visit Information  Last PT Received On 08/12/11  Precautions  Precautions Fall  Restrictions  Weight Bearing Restrictions Yes  RLE Weight Bearing WBAT  Bed Mobility  Bed Mobility Yes  Sit to Supine 4: Min assist  Sit to Supine - Details (indicate cue type and reason) A with R LE  Transfers  Transfers Yes  Sit to Stand 4: Min assist;With upper extremity assist;From chair/3-in-1  Sit to Stand Details (indicate cue type and reason) cues for use of UEs  Stand to Sit 4: Min assist;With upper extremity assist;To bed  Stand to Sit Details cues for UE use, control descent  Ambulation/Gait  Ambulation/Gait No  Stairs No  Wheelchair Mobility  Wheelchair Mobility No  Posture/Postural Control  Posture/Postural Control No significant limitations  Balance  Balance Assessed No  Total Joint Exercises  Long Arc Stronach;Right;10 reps  Knee Flexion AAROM;Right;10 reps  PT - End of Session  Equipment Utilized During Treatment Gait belt  Activity Tolerance Patient limited by pain  Patient left in bed;in CPM;with call bell in reach  Nurse Communication Mobility status for transfers;Mobility status for ambulation  General  Behavior During Session [facility] for tasks performed  Cognition [facility] for tasks performed  PT - Assessment/Plan  Comments on Treatment Session pt presents s/p TKA.  pt still imited by pain.  pt ed on importance of ROM.  pt placed in CPM 0-35degrees.    PT Plan Discharge plan remains appropriate;Frequency remains appropriate  PT Frequency 7X/week  Follow Up Recommendations Home health PT;Supervision - Intermittent  Equipment Recommended None recommended by PT  Acute Rehab PT Goals  PT Goal: Sit to Supine/Side - Progress Progressing toward goal  PT Goal: Sit to Stand - Progress Progressing toward goal  PT Goal: Stand to Sit - Progress Progressing toward goal  PT Goal: Perform Home Exercise Program - Progress Progressing toward goal    Mack Hook, PT [phone]

## 2011-08-12 NOTE — Progress Notes (Signed)
Patient ID: ELFA WOOTON, female   DOB: 1961/02/14, 51 y.o.   MRN: [id number] PATIENT ID: SAADIA DEWITT  MRN: [id number]  DOB/AGE:  03-24-1961 / 51 y.o.  1 Day Post-Op Procedure(s) (LRB): TOTAL KNEE ARTHROPLASTY (Right)    PROGRESS NOTE Subjective: Patient is alert, oriented, no Nausea, no Vomiting, yes} passing gas, no Bowel Movement. Taking PO well. Denies SOB, Chest or Calf Pain. Using Incentive Spirometer, PAS in place. Ambulate WBAT, CPM 0-30 Patient reports pain as 8 on 0-10 scale  .    Objective: Vital signs in last 24 hours: Filed Vitals:   08/11/11 1424 08/11/11 2106 08/12/11 0300 08/12/11 0500  BP: 111/79 110/72 122/82 117/81  Pulse: 82 89 91 96  Temp: 97.6 F (36.4 C) 98.6 F (37 C) 98.7 F (37.1 C) 98.7 F (37.1 C)  TempSrc: Oral Oral  Oral  Resp: 16 16 20 17   SpO2: 97% 99% 97% 97%      Intake/Output from previous day: I/O last 3 completed shifts: In: 4405 [P.O.:1040; I.V.:3310; IV Piggyback:55] Out: 2750 [Urine:2700; Blood:50]   Intake/Output this shift:     LABORATORY DATA:  Basename 08/12/11 0515  WBC 8.3  HGB 10.6*  HCT 31.6*  PLT 202  NA 137  K 3.8  CL 96  CO2 33*  BUN 6  CREATININE 0.96  GLUCOSE 125*  GLUCAP --  INR 1.08  CALCIUM 8.9    Examination: Neurologically intact ABD soft Neurovascular intact Sensation intact distally Intact pulses distally Dorsiflexion/Plantar flexion intact Incision: scant drainage No cellulitis present Compartment soft Blood and plasma separated in drain indicating minimal recent drainage, drain pulled without difficulty.   Assessment:   1 Day Post-Op Procedure(s) (LRB): TOTAL KNEE ARTHROPLASTY (Right) ADDITIONAL DIAGNOSIS:    Plan: PT/OT WBAT, CPM 5/hrs day until ROM 0-90 degrees, then D/C CPM DVT Prophylaxis:  Lovenox\Coumadin bridge target INR 1.5-2.0 DISCHARGE PLAN: Home DISCHARGE NEEDS: HHPT, HHRN, CPM, Walker and 3-in-1 comode seat     Kandi Brusseau J 08/12/2011, 7:33 AM

## 2011-08-12 NOTE — Progress Notes (Signed)
Clinical Social Work-CSW received inappropriate referral-per chart, MD/PN and CM pt will d/c home with home health-No CSW needs-Valerie Moore-MSW, [phone]

## 2011-08-12 NOTE — Progress Notes (Addendum)
ANTICOAGULATION CONSULT NOTE - Follow Up Consult  Pharmacy Consult for Warfarin Indication: VTE prophylaxis  Allergies  Allergen Reactions  . Penicillins   . Shellfish Allergy    Admit Complaint: 51 yo F admitted s/p R TKA.  Pharmacy consulted to dose warfarin.  Overnight Events: 08/12/2011  POD 1,   Assessment: Anticoagulation: DVT Prophylaxis: warfarin, INR up slightly after first dose, no bleeding noted, drains removed today PTA Medication Issues: All Home Meds Ordered: Best Practices: DVT Prophylaxis:   Goal of Therapy:  INR 2-3  (see addendum for new goal)   Plan:  1. Warfarin 7.5 mg PO x 1 today 2. Daily INR.    Vital Signs: Temp: 97.3 F (36.3 C) (03/19 0800) Temp src: Oral (03/19 0800) BP: 114/76 mmHg (03/19 0800) Pulse Rate: 99  (03/19 0800)  Labs:  Basename 08/12/11 0515  HGB 10.6*  HCT 31.6*  PLT 202  APTT --  LABPROT 14.2  INR 1.08  HEPARINUNFRC --  CREATININE 0.96  CKTOTAL --  CKMB --  TROPONINI --   CrCl is unknown because there is no height on file for the current visit.  Medications:  Prescriptions prior to admission  Medication Sig Dispense Refill  . allopurinol (ZYLOPRIM) 300 MG tablet Take 600 mg by mouth at bedtime.      Marland Kitchen amitriptyline (ELAVIL) 100 MG tablet Take 100 mg by mouth at bedtime.      Marland Kitchen atenolol (TENORMIN) 50 MG tablet Take 50 mg by mouth every morning.      Marland Kitchen atorvastatin (LIPITOR) 20 MG tablet Take 20 mg by mouth every morning.      . clonazePAM (KLONOPIN) 0.5 MG tablet Take 0.25 mg by mouth at bedtime as needed. For sleep      . colchicine 0.6 MG tablet Take 0.6 mg by mouth at bedtime.      . DULoxetine (CYMBALTA) 30 MG capsule Take 90 mg by mouth at bedtime.      . furosemide (LASIX) 80 MG tablet Take 80 mg by mouth every morning.      Marland Kitchen HYDROcodone-acetaminophen (NORCO) 5-325 MG per tablet Take 2-3 tablets by mouth 2 (two) times daily as needed. For back and knee pain      . levothyroxine (SYNTHROID, LEVOTHROID) 88  MCG tablet Take 88 mcg by mouth at bedtime.      . modafinil (PROVIGIL) 200 MG tablet Take 200-400 mg by mouth daily.      . Multiple Vitamin (MULITIVITAMIN WITH MINERALS) TABS Take 1 tablet by mouth daily.      . nitrofurantoin, macrocrystal-monohydrate, (MACROBID) 100 MG capsule Take 100 mg by mouth daily as needed. After intercourse if pt feels an onset of infection (per pt) and/or once daily for a course of 7days      . pantoprazole (PROTONIX) 40 MG tablet Take 40 mg by mouth 2 (two) times daily.      . potassium chloride (K-DUR) 10 MEQ tablet Take 20 mEq by mouth every morning.      Marland Kitchen VITAMIN B1-B12 IJ Inject 1,000 mcg as directed every 30 (thirty) days. On or around the 16 th of each month      . levalbuterol (XOPENEX) 1.25 MG/0.5ML nebulizer solution Take 1 ampule by nebulization every 4 (four) hours as needed. For shortness of breath/wheezing      . rizatriptan (MAXALT) 5 MG tablet Take 5-10 mg by mouth 2 (two) times daily as needed. For migraine headaches          Juliette  Christine Virginia Crews 08/12/2011,1:40 PM   Addendum:  INR goal =1.5-2 per Dr. Wadie Lessen note today  Plan: Change today's Coumadin dose to 6mg  po x1  INR qday.  Arman Filter, RPh 08/12/2011, 17:06

## 2011-08-13 LAB — CBC
HCT: 31.4 % — ABNORMAL LOW (ref 36.0–46.0)
Hemoglobin: 10.5 g/dL — ABNORMAL LOW (ref 12.0–15.0)
MCH: 31.9 pg (ref 26.0–34.0)
MCV: 95.4 fL (ref 78.0–100.0)
RBC: 3.29 MIL/uL — ABNORMAL LOW (ref 3.87–5.11)

## 2011-08-13 LAB — PROTIME-INR: Prothrombin Time: 19 seconds — ABNORMAL HIGH (ref 11.6–15.2)

## 2011-08-13 MED ORDER — WARFARIN SODIUM 4 MG PO TABS
4.0000 mg | ORAL_TABLET | Freq: Once | ORAL | Status: AC
  Administered 2011-08-13: 4 mg via ORAL
  Filled 2011-08-13: qty 1

## 2011-08-13 NOTE — Progress Notes (Signed)
Agree with PTA.    Juliano Mceachin, PT [phone]  

## 2011-08-13 NOTE — Progress Notes (Signed)
PATIENT ID: Valerie Moore  MRN: [id number]  DOB/AGE:  07/03/1960 / 51 y.o.  2 Days Post-Op Procedure(s) (LRB): TOTAL KNEE ARTHROPLASTY (Right)    PROGRESS NOTE Subjective: Patient is alert, oriented, no Nausea, no Vomiting, yes} passing gas, no Bowel Movement. Taking PO well. Denies SOB, Chest or Calf Pain. Using Incentive Spirometer, PAS in place. Ambulating well with PT. Patient reports pain as moderate  .    Objective: Vital signs in last 24 hours: Filed Vitals:   08/12/11 1400 08/12/11 1628 08/12/11 2125 08/13/11 0606  BP:   107/70 113/68  Pulse: 106  110 98  Temp: 98.6 F (37 C)  99.8 F (37.7 C) 98.9 F (37.2 C)  TempSrc:      Resp: 16  18 18   Height:  5\' 9"  (1.753 m)    Weight:  93.849 kg (206 lb 14.4 oz)    SpO2:   100% 96%      Intake/Output from previous day: I/O last 3 completed shifts: In: 5180 [P.O.:1980; I.V.:3200] Out: 3100 [Urine:3100]   Intake/Output this shift:     LABORATORY DATA:  Basename 08/13/11 0620 08/12/11 0515  WBC 10.3 8.3  HGB 10.5* 10.6*  HCT 31.4* 31.6*  PLT 213 202  NA -- 137  K -- 3.8  CL -- 96  CO2 -- 33*  BUN -- 6  CREATININE -- 0.96  GLUCOSE -- 125*  GLUCAP -- --  INR 1.56* 1.08  CALCIUM -- 8.9    Examination: Neurologically intact ABD soft Neurovascular intact Sensation intact distally Intact pulses distally Dorsiflexion/Plantar flexion intact Incision: dressing C/D/I}  Assessment:   2 Days Post-Op Procedure(s) (LRB): TOTAL KNEE ARTHROPLASTY (Right) ADDITIONAL DIAGNOSIS:  none  Plan: PT/OT WBAT, CPM 5/hrs day until ROM 0-90 degrees, then D/C CPM DVT Prophylaxis:  Lovenox\Coumadin bridge target INR 1.5-2.0 DISCHARGE PLAN: Home Thursday DISCHARGE NEEDS: HHPT, HHRN, CPM, Walker and 3-in-1 comode seat     Markevious Ehmke M. 08/13/2011, 8:34 AM

## 2011-08-13 NOTE — Progress Notes (Signed)
ANTICOAGULATION CONSULT NOTE - Follow Up Consult  Pharmacy Consult for Warfarin Indication: VTE prophylaxis  Allergies  Allergen Reactions  . Penicillins   . Shellfish Allergy    Admit Complaint: 51 yo F admitted s/p R TKA.  Pharmacy consulted to dose warfarin.  Overnight Events: 08/13/2011  POD 1,   Assessment: Anticoagulation: DVT Prophylaxis: warfarin, INR up to 1.56, no bleeding noted PTA Medication Issues: All Home Meds Ordered: Best Practices: DVT Prophylaxis:   Goal of Therapy:  INR 1.5-2   Plan:  1. Warfarin 4 mg PO x 1 today 2. Daily INR.    Vital Signs: Temp: 98.9 F (37.2 C) (03/20 0606) BP: 113/68 mmHg (03/20 0606) Pulse Rate: 98  (03/20 0606)  Labs:  Basename 08/13/11 0620 08/12/11 0515  HGB 10.5* 10.6*  HCT 31.4* 31.6*  PLT 213 202  APTT -- --  LABPROT 19.0* 14.2  INR 1.56* 1.08  HEPARINUNFRC -- --  CREATININE -- 0.96  CKTOTAL -- --  CKMB -- --  TROPONINI -- --   Estimated Creatinine Clearance: 83.5 ml/min (by C-G formula based on Cr of 0.96).  Medications:  Prescriptions prior to admission  Medication Sig Dispense Refill  . allopurinol (ZYLOPRIM) 300 MG tablet Take 600 mg by mouth at bedtime.      Marland Kitchen amitriptyline (ELAVIL) 100 MG tablet Take 100 mg by mouth at bedtime.      Marland Kitchen atenolol (TENORMIN) 50 MG tablet Take 50 mg by mouth every morning.      Marland Kitchen atorvastatin (LIPITOR) 20 MG tablet Take 20 mg by mouth every morning.      . clonazePAM (KLONOPIN) 0.5 MG tablet Take 0.25 mg by mouth at bedtime as needed. For sleep      . colchicine 0.6 MG tablet Take 0.6 mg by mouth at bedtime.      . DULoxetine (CYMBALTA) 30 MG capsule Take 90 mg by mouth at bedtime.      . furosemide (LASIX) 80 MG tablet Take 80 mg by mouth every morning.      Marland Kitchen HYDROcodone-acetaminophen (NORCO) 5-325 MG per tablet Take 2-3 tablets by mouth 2 (two) times daily as needed. For back and knee pain      . levothyroxine (SYNTHROID, LEVOTHROID) 88 MCG tablet Take 88 mcg by mouth  at bedtime.      . modafinil (PROVIGIL) 200 MG tablet Take 200-400 mg by mouth daily.      . Multiple Vitamin (MULITIVITAMIN WITH MINERALS) TABS Take 1 tablet by mouth daily.      . nitrofurantoin, macrocrystal-monohydrate, (MACROBID) 100 MG capsule Take 100 mg by mouth daily as needed. After intercourse if pt feels an onset of infection (per pt) and/or once daily for a course of 7days      . pantoprazole (PROTONIX) 40 MG tablet Take 40 mg by mouth 2 (two) times daily.      . potassium chloride (K-DUR) 10 MEQ tablet Take 20 mEq by mouth every morning.      Marland Kitchen VITAMIN B1-B12 IJ Inject 1,000 mcg as directed every 30 (thirty) days. On or around the 16 th of each month      . levalbuterol (XOPENEX) 1.25 MG/0.5ML nebulizer solution Take 1 ampule by nebulization every 4 (four) hours as needed. For shortness of breath/wheezing      . rizatriptan (MAXALT) 5 MG tablet Take 5-10 mg by mouth 2 (two) times daily as needed. For migraine headaches          Valerie Moore 08/13/2011,9:22 AM

## 2011-08-13 NOTE — Progress Notes (Signed)
Physical Therapy Treatment Patient Details Name: ROMY IPOCK MRN: [id number] DOB: 06-09-1960 Today's Date: 08/13/2011  PT Assessment/Plan  PT - Assessment/Plan Comments on Treatment Session: Pt progressing well with ambulation. Overall states that she is feeling a lot better and pain is more manageable. Will need to attempt stairs next session PT Plan: Discharge plan remains appropriate PT Frequency: 7X/week Follow Up Recommendations: Home health PT;Supervision - Intermittent Equipment Recommended: Rolling walker with 5" wheels PT Goals  Acute Rehab PT Goals PT Goal: Sit to Supine/Side - Progress: Progressing toward goal PT Goal: Sit to Stand - Progress: Progressing toward goal PT Goal: Stand to Sit - Progress: Progressing toward goal PT Goal: Ambulate - Progress: Progressing toward goal PT Goal: Perform Home Exercise Program - Progress: Progressing toward goal  PT Treatment Precautions/Restrictions  Precautions Precautions: Fall Required Braces or Orthoses: No Restrictions Weight Bearing Restrictions: Yes RLE Weight Bearing: Weight bearing as tolerated Mobility (including Balance) Bed Mobility Bed Mobility: Yes Transfers Sit to Stand: 5: Supervision;From chair/3-in-1;With armrests;With upper extremity assist Stand to Sit: 5: Supervision;To chair/3-in-1;With upper extremity assist;With armrests Stand to Sit Details: Cues to sit slowly Ambulation/Gait Ambulation/Gait: Yes Ambulation/Gait Assistance: 4: Min assist;Other (comment) (MinGuard A) Ambulation/Gait Assistance Details (indicate cue type and reason): Cues for step length and RW management. Cues for technique Ambulation Distance (Feet): 140 Feet Assistive device: Rolling walker Gait Pattern: Step-to pattern;Decreased step length - right;Decreased step length - left;Trunk flexed    Exercise  Total Joint Exercises Ankle Circles/Pumps: AROM;Both;10 reps Quad Sets: Right;10 reps;AROM Heel Slides: AAROM;Right;10  reps Hip ABduction/ADduction: AAROM;Right;10 reps Straight Leg Raises: AAROM;Right;10 reps End of Session PT - End of Session Equipment Utilized During Treatment: Gait belt Activity Tolerance: Patient tolerated treatment well Patient left: in chair;with call bell in reach;with family/visitor present General Behavior During Session: [facility] for tasks performed Cognition: [facility] for tasks performed  Kaegan Stigler, Adline Potter 08/13/2011, 2:16 PM 08/13/2011 Fredrich Birks PTA [phone] pager [phone] office

## 2011-08-13 NOTE — Progress Notes (Signed)
CARE MANAGEMENT NOTE 08/13/2011       Action/Plan:   Spoke with patient and her husband. Choice offered. Preoperatively setup with [facility],  no changes. CPM to be delivered to the home, has 3in1. Needs rolling walker, will order.   Anticipated DC Date:  08/14/2011   Anticipated DC Plan:  HOME W HOME HEALTH SERVICES      DC Planning Services  CM consult      PAC Choice  DURABLE MEDICAL EQUIPMENT  HOME HEALTH   Choice offered to / List presented to:  C-1 Patient   DME arranged  Levan Hurst      DME agency  [facility]     [facility] arranged  HH-2 PT  HH-1 RN      [facility] agency  [facility]   Status of service:  Completed, signed off  Discharge Disposition:  HOME W HOME HEALTH SERVICES

## 2011-08-13 NOTE — Progress Notes (Signed)
Occupational Therapy Evaluation Patient Details Name: Valerie Moore MRN: [id number] DOB: [birth year] Today's Date: 08/13/2011  Problem List:  Patient Active Problem List  Diagnoses  . Degenerative arthritis of right knee    Past Medical History:  Past Medical History  Diagnosis Date  . Gout   . Hypertension   . Hyperlipemia   . Hypothyroidism   . PONV (postoperative nausea and vomiting)   . Bronchitis   . Depression   . GERD (gastroesophageal reflux disease)   . Headache   . Chronic kidney disease     no NSIADS/anitinflammatory   Past Surgical History:  Past Surgical History  Procedure Date  . Quadriceps repair     lft  . Cesarean section   . Carpal tunnel release     rt  . Cholecystectomy   . Rotator cuff repair     bilateral  . Total knee arthroplasty 08/11/2011    Procedure: TOTAL KNEE ARTHROPLASTY;  Surgeon: Nestor Lewandowsky, MD;  Location: [facility] OR;  Service: Orthopedics;  Laterality: Right;  DEPUY/ SIGMA     OT Assessment/Plan/Recommendation OT Assessment Clinical Impression Statement: 51yo s/p TKA..  OTcompelted all AE and DMEeducation to maximize independence ADl and mobility for ADL. Husband will be home until Monday. Husband was present for education and verbalized understanding. Rec "hip kit" for home use to increase safety and independence.  No further OT needs. OT signing off. OT Recommendation/Assessment: Patient does not need any further OT services OT Recommendation Follow Up Recommendations: No OT follow up Equipment Recommended: None recommended by OT OT Goals Acute Rehab OT Goals OT Goal Formulation:  (eval only)  OT Evaluation Precautions/Restrictions  Precautions Precautions: Fall Required Braces or Orthoses: No Restrictions Weight Bearing Restrictions: Yes RLE Weight Bearing: Weight bearing as tolerated Prior Functioning Home Living Lives With: Spouse Receives Help From: Family Type of Home: House Home Layout: One level (2 steps between  bedroom and living area) Home Access: Stairs to enter;Ramped entrance Entrance Stairs-Number of Steps: at back has ramp and then single step Bathroom Shower/Tub: Tub/shower unit;Curtain Firefighter: Handicapped height Bathroom Accessibility: Yes How Accessible: Accessible via walker Home Adaptive Equipment: Wheelchair - manual;Walker - rolling;Straight cane;Shower chair with back;Tub transfer bench;Bedside commode/3-in-1 Additional Comments: husband will return to [facility] Monday. Prior Function Level of Independence: Independent with basic ADLs;Independent with homemaking with ambulation;Independent with gait;Independent with transfers Able to Take Stairs?: Yes Driving: Yes Vocation: Full time employment Vocation Requirements: clerical ADL ADL Eating/Feeding: Independent Where Assessed - Eating/Feeding: Chair Grooming: Simulated;Modified independent Where Assessed - Grooming: Sitting, chair Upper Body Bathing: Simulated;Set up Where Assessed - Upper Body Bathing: Sitting, chair Lower Body Bathing: Set up;Simulated Where Assessed - Lower Body Bathing: Sit to stand from chair;Unsupported Where Assessed - Upper Body Dressing: Unsupported;Sitting, chair Lower Body Dressing: Simulated;Other (comment);Supervision/safety;Set up (using AE) Where Assessed - Lower Body Dressing: Sit to stand from chair;Unsupported Toilet Transfer: Performed;Supervision/safety Toilet Transfer Method: Proofreader: Bedside commode Toileting - Clothing Manipulation: Performed;Independent Where Assessed - Toileting Clothing Manipulation: Standing Toileting - Hygiene: Performed;Independent Where Assessed - Toileting Hygiene: Sit to stand from 3-in-1 or toilet Tub/Shower Transfer: Not assessed;Other (comment) (discussed using tub bench. Pt familiar with transfer) Vision/Perception  Vision - History Baseline Vision: No visual deficits Cognition Cognition Arousal/Alertness:  Awake/alert Overall Cognitive Status: Appears within functional limits for tasks assessed Orientation Level: Oriented X4 Sensation/Coordination   Extremity Assessment RUE Assessment RUE Assessment: Within Functional Limits LUE Assessment LUE Assessment: Within Functional Limits Mobility  Bed Mobility Bed  Mobility: Yes Transfers Transfers: Yes Sit to Stand: 5: Supervision;With upper extremity assist Stand to Sit: 5: Supervision;With upper extremity assist Exercises   End of Session OT - End of Session Equipment Utilized During Treatment: Gait belt Activity Tolerance: Patient limited by pain Patient left: in bed;with call bell in reach;with family/visitor present General Behavior During Session: [facility] for tasks performed Cognition: [facility] for tasks performed   Osualdo Hansell,HILLARY 08/13/2011, 2:12 PM  [facility], OTR/L  [phone] 08/13/2011

## 2011-08-14 LAB — PROTIME-INR: Prothrombin Time: 18.9 seconds — ABNORMAL HIGH (ref 11.6–15.2)

## 2011-08-14 LAB — CBC
HCT: 27.3 % — ABNORMAL LOW (ref 36.0–46.0)
Hemoglobin: 9.2 g/dL — ABNORMAL LOW (ref 12.0–15.0)
MCH: 32.1 pg (ref 26.0–34.0)
RBC: 2.87 MIL/uL — ABNORMAL LOW (ref 3.87–5.11)

## 2011-08-14 MED ORDER — WARFARIN SODIUM 5 MG PO TABS: 5.0000 mg | ORAL_TABLET | Freq: Every day | ORAL | Status: DC

## 2011-08-14 MED ORDER — OXYCODONE-ACETAMINOPHEN 5-325 MG PO TABS: 1.0000 | ORAL_TABLET | ORAL | Status: AC | PRN

## 2011-08-14 MED ORDER — METHOCARBAMOL 500 MG PO TABS: 500.0000 mg | ORAL_TABLET | Freq: Four times a day (QID) | ORAL | Status: AC | PRN

## 2011-08-14 NOTE — Discharge Summary (Signed)
Patient ID: Valerie Moore MRN: [id number] DOB/AGE: [birth year] 51 y.o.  Admit date: 08/11/2011 Discharge date: 08/14/2011  Admission Diagnoses:  Active Problems:  Degenerative arthritis of right knee   Discharge Diagnoses:  Same  Past Medical History  Diagnosis Date  . Gout   . Hypertension   . Hyperlipemia   . Hypothyroidism   . PONV (postoperative nausea and vomiting)   . Bronchitis   . Depression   . GERD (gastroesophageal reflux disease)   . Headache   . Chronic kidney disease     no NSIADS/anitinflammatory    Surgeries: Procedure(s): TOTAL KNEE ARTHROPLASTY on 08/11/2011   Consultants:    Discharged Condition: Improved  Hospital Course: Valerie Moore is an 51 y.o. female who was admitted 08/11/2011 for operative treatment of<principal problem not specified>. Patient has severe unremitting pain that affects sleep, daily activities, and work/hobbies. After pre-op clearance the patient was taken to the operating room on 08/11/2011 and underwent  Procedure(s): TOTAL KNEE ARTHROPLASTY.    Patient was given perioperative antibiotics: Anti-infectives     Start     Dose/Rate Route Frequency Ordered Stop   08/11/11 1016   cefUROXime (ZINACEF) injection  Status:  Discontinued          As needed 08/11/11 1016 08/11/11 1121   08/11/11 0749   ceFAZolin (ANCEF) 2-3 GM-% IVPB SOLR     Comments: Kathrene Bongo: cabinet override         08/11/11 0749 08/11/11 1959   08/11/11 0744   ceFAZolin (ANCEF) IVPB 2 g/50 mL premix  Status:  Discontinued        2 g 100 mL/hr over 30 Minutes Intravenous 60 min pre-op 08/11/11 0744 08/11/11 1234   08/11/11 0727   ceFAZolin (ANCEF) IVPB 1 g/50 mL premix  Status:  Discontinued        1 g 100 mL/hr over 30 Minutes Intravenous 60 min pre-op 08/11/11 0727 08/11/11 0744           Patient was given sequential compression devices, early ambulation, and chemoprophylaxis to prevent DVT.  Patient benefited maximally from hospital stay and there  were no complications.    Recent vital signs: Patient Vitals for the past 24 hrs:  BP Temp Temp src Pulse Resp SpO2  08/14/11 0526 117/80 mmHg 98.9 F (37.2 C) - 107  18  94 %  2011/08/30 2056 111/75 mmHg 98.8 F (37.1 C) - 100  20  92 %  08-30-11 1400 93/62 mmHg 98.8 F (37.1 C) - 107  20  92 %  2011-08-30 1100 111/73 mmHg 98.7 F (37.1 C) Oral 102  18  86 %     Recent laboratory studies:  Basename 08/14/11 0643 2011/08/30 0620 08/12/11 0515  WBC 6.8 10.3 --  HGB 9.2* 10.5* --  HCT 27.3* 31.4* --  PLT 209 213 --  NA -- -- 137  K -- -- 3.8  CL -- -- 96  CO2 -- -- 33*  BUN -- -- 6  CREATININE -- -- 0.96  GLUCOSE -- -- 125*  INR 1.55* 1.56* --  CALCIUM -- -- 8.9     Discharge Medications:   Medication List  As of 08/14/2011  8:09 AM   STOP taking these medications         HYDROcodone-acetaminophen 5-325 MG per tablet         TAKE these medications         allopurinol 300 MG tablet   Commonly known as: ZYLOPRIM  Take 600 mg by mouth at bedtime.      amitriptyline 100 MG tablet   Commonly known as: ELAVIL   Take 100 mg by mouth at bedtime.      atenolol 50 MG tablet   Commonly known as: TENORMIN   Take 50 mg by mouth every morning.      atorvastatin 20 MG tablet   Commonly known as: LIPITOR   Take 20 mg by mouth every morning.      clonazePAM 0.5 MG tablet   Commonly known as: KLONOPIN   Take 0.25 mg by mouth at bedtime as needed. For sleep      colchicine 0.6 MG tablet   Take 0.6 mg by mouth at bedtime.      DULoxetine 30 MG capsule   Commonly known as: CYMBALTA   Take 90 mg by mouth at bedtime.      furosemide 80 MG tablet   Commonly known as: LASIX   Take 80 mg by mouth every morning.      levalbuterol 1.25 MG/0.5ML nebulizer solution   Commonly known as: XOPENEX   Take 1 ampule by nebulization every 4 (four) hours as needed. For shortness of breath/wheezing      levothyroxine 88 MCG tablet   Commonly known as: SYNTHROID, LEVOTHROID   Take 88  mcg by mouth at bedtime.      methocarbamol 500 MG tablet   Commonly known as: ROBAXIN   Take 1 tablet (500 mg total) by mouth every 6 (six) hours as needed.      modafinil 200 MG tablet   Commonly known as: PROVIGIL   Take 200-400 mg by mouth daily.      mulitivitamin with minerals Tabs   Take 1 tablet by mouth daily.      nitrofurantoin (macrocrystal-monohydrate) 100 MG capsule   Commonly known as: MACROBID   Take 100 mg by mouth daily as needed. After intercourse if pt feels an onset of infection (per pt) and/or once daily for a course of 7days      oxyCODONE-acetaminophen 5-325 MG per tablet   Commonly known as: PERCOCET   Take 1-2 tablets by mouth every 4 (four) hours as needed.      pantoprazole 40 MG tablet   Commonly known as: PROTONIX   Take 40 mg by mouth 2 (two) times daily.      potassium chloride 10 MEQ tablet   Commonly known as: K-DUR   Take 20 mEq by mouth every morning.      rizatriptan 5 MG tablet   Commonly known as: MAXALT   Take 5-10 mg by mouth 2 (two) times daily as needed. For migraine headaches      VITAMIN B1-B12 IJ   Inject 1,000 mcg as directed every 30 (thirty) days. On or around the 74 th of each month            Diagnostic Studies: Dg Chest 2 View  08/04/2011  *RADIOLOGY REPORT*  Clinical Data: Preop radiograph.  Right knee osteoarthritis.  CHEST - 2 VIEW  Comparison: None  Findings: Heart size is normal.  No pleural effusion or pulmonary edema.  No airspace consolidation identified.  There is a mild multilevel spondylosis within the thoracic spine.  IMPRESSION:  1.  No active cardiopulmonary abnormalities. 2.  Thoracic spondylosis noted.  Original Report Authenticated By: Rosealee Albee, M.D.    Disposition: Home, HHN for PT, wound care, coumadin  Discharge Orders    Future Orders Please Complete By  Expires   Increase activity slowly      Walker       May shower / Bathe      Driving Restrictions      Comments:   No driving for 2  weeks.   Change dressing (specify)      Comments:   Dressing change as needed.   Call MD for:  temperature >100.4      Call MD for:  severe uncontrolled pain      Call MD for:  redness, tenderness, or signs of infection (pain, swelling, redness, odor or green/yellow discharge around incision site)      Discharge instructions      Comments:   F/U with Dr. Turner Daniels in 10 days         Signed: Hazle Nordmann. 08/14/2011, 8:09 AM

## 2011-08-14 NOTE — Progress Notes (Signed)
Physical Therapy Treatment Patient Details Name: Valerie Moore MRN: [id number] DOB: 1960/09/08 Today's Date: 08/14/2011  PT Assessment/Plan  PT - Assessment/Plan Comments on Treatment Session: Pt admitted s/p right TKA and is progressing great.  Pt is ready for safe d/c home once medically cleared by MD. PT Plan: Discharge plan remains appropriate;Frequency remains appropriate PT Frequency: 7X/week Follow Up Recommendations: Home health PT;Supervision - Intermittent Equipment Recommended: Rolling walker with 5" wheels PT Goals  Acute Rehab PT Goals PT Goal Formulation: With patient Time For Goal Achievement: 7 days PT Goal: Supine/Side to Sit - Progress: Met PT Goal: Sit to Supine/Side - Progress: Met PT Goal: Sit to Stand - Progress: Met PT Goal: Stand to Sit - Progress: Met PT Goal: Ambulate - Progress: Met PT Goal: Up/Down Stairs - Progress: Met  PT Treatment Precautions/Restrictions  Precautions Precautions: Knee Precaution Booklet Issued: No Required Braces or Orthoses: No Restrictions Weight Bearing Restrictions: Yes RLE Weight Bearing: Weight bearing as tolerated Pain 5/10 in right knee.  Pt repositioned and ice applied. Mobility (including Balance) Bed Mobility Bed Mobility: Yes Supine to Sit: 6: Modified independent (Device/Increase time);HOB flat Supine to Sit Details (indicate cue type and reason): Pt able to use leg lifter to get EOB. Sitting - Scoot to Edge of Bed: 6: Modified independent (Device/Increase time) Sit to Supine: 6: Modified independent (Device/Increase time);HOB flat Sit to Supine - Details (indicate cue type and reason): Pt able to use leg lifter to get back in bed. Transfers Transfers: Yes Sit to Stand: 6: Modified independent (Device/Increase time);With upper extremity assist;From bed;From chair/3-in-1 (2 trials.) Stand to Sit: 6: Modified independent (Device/Increase time);With upper extremity assist;To chair/3-in-1;To bed (2  trials.) Ambulation/Gait Ambulation/Gait: Yes Ambulation/Gait Assistance: 6: Modified independent (Device/Increase time) Ambulation Distance (Feet): 200 Feet Assistive device: Rolling walker Gait Pattern: Step-to pattern;Decreased step length - right;Decreased stance time - right Stairs: Yes Stairs Assistance: 5: Supervision Stairs Assistance Details (indicate cue type and reason): Verbal cues for safest sequence with "up with good, down with bad." Stair Management Technique: Two rails;Step to pattern;Forwards (1st trial as above. 2nd trial backward with RW step-to.) Number of Stairs: 4  (2 trials of 2 stairs each.) Height of Stairs: 8  (inches) Wheelchair Mobility Wheelchair Mobility: No  Posture/Postural Control Posture/Postural Control: No significant limitations Balance Balance Assessed: No End of Session PT - End of Session Equipment Utilized During Treatment: Gait belt Activity Tolerance: Patient tolerated treatment well Patient left: in bed;with call bell in reach;with family/visitor present Nurse Communication: Mobility status for transfers;Mobility status for ambulation General Behavior During Session: [facility] for tasks performed Cognition: [facility] for tasks performed  Cephus Shelling 08/14/2011, 1:15 PM  08/14/2011 Cephus Shelling, PT, DPT [phone]

## 2011-08-14 NOTE — Progress Notes (Signed)
PATIENT ID: Valerie Moore  MRN: [id number]  DOB/AGE:  [birth year] / 51 y.o.  3 Days Post-Op Procedure(s) (LRB): TOTAL KNEE ARTHROPLASTY (Right)    PROGRESS NOTE Subjective: Patient is alert, oriented, no Nausea, no Vomiting, yes} passing gas, no Bowel Movement. Taking PO well. Denies SOB, Chest or Calf Pain. Using Incentive Spirometer, PAS in place. Ambulating well. Patient reports pain as moderate  .    Objective: Vital signs in last 24 hours: Filed Vitals:   08/13/11 1100 08/13/11 1400 08/13/11 2056 08/14/11 0526  BP: 111/73 93/62 111/75 117/80  Pulse: 102 107 100 107  Temp: 98.7 F (37.1 C) 98.8 F (37.1 C) 98.8 F (37.1 C) 98.9 F (37.2 C)  TempSrc: Oral     Resp: 18 20 20 18   Height:      Weight:      SpO2: 86% 92% 92% 94%      Intake/Output from previous day: I/O last 3 completed shifts: In: 1820 [P.O.:870; I.V.:950] Out: -    Intake/Output this shift:     LABORATORY DATA:  Basename 08/14/11 0643 08/13/11 0620 08/12/11 0515  WBC 6.8 10.3 --  HGB 9.2* 10.5* --  HCT 27.3* 31.4* --  PLT 209 213 --  NA -- -- 137  K -- -- 3.8  CL -- -- 96  CO2 -- -- 33*  BUN -- -- 6  CREATININE -- -- 0.96  GLUCOSE -- -- 125*  GLUCAP -- -- --  INR 1.55* 1.56* --  CALCIUM -- -- 8.9    Examination: Neurologically intact ABD soft Neurovascular intact Sensation intact distally Intact pulses distally Dorsiflexion/Plantar flexion intact Incision: dressing C/D/I}  Assessment:   3 Days Post-Op Procedure(s) (LRB): TOTAL KNEE ARTHROPLASTY (Right) ADDITIONAL DIAGNOSIS:  none  Plan: PT/OT WBAT, CPM 5/hrs day until ROM 0-90 degrees, then D/C CPM DVT Prophylaxis:  Lovenox\Coumadin bridge target INR 1.5-2.0 DISCHARGE PLAN: Home today DISCHARGE NEEDS: HHPT, HHRN, CPM, Walker and 3-in-1 comode seat     Jurell Basista M. 08/14/2011, 8:05 AM

## 2011-08-14 NOTE — Progress Notes (Signed)
ANTICOAGULATION CONSULT NOTE - Follow Up Consult  Pharmacy Consult for Coumadin Indication: VTE prophylaxis s/p R TKA  Allergies  Allergen Reactions  . Penicillins   . Shellfish Allergy     Patient Measurements: Height: 5\' 9"  (175.3 cm) (from 08/04/11 preadmit) Weight: 206 lb 14.4 oz (93.849 kg) (from 08/04/11 preadmit) IBW/kg (Calculated) : 66.2   Vital Signs: Temp: 98.9 F (37.2 C) (03/21 0526) BP: 117/80 mmHg (03/21 0526) Pulse Rate: 107  (03/21 0526)  Labs:  Valerie Moore 08/14/11 4098 08/13/11 0620 08/12/11 0515  HGB 9.2* 10.5* --  HCT 27.3* 31.4* 31.6*  PLT 209 213 202  APTT -- -- --  LABPROT 18.9* 19.0* 14.2  INR 1.55* 1.56* 1.08  HEPARINUNFRC -- -- --  CREATININE -- -- 0.96  CKTOTAL -- -- --  CKMB -- -- --  TROPONINI -- -- --   Estimated Creatinine Clearance: 83.5 ml/min (by C-G formula based on Cr of 0.96).   Assessment: Pt with therapeutic INR for lower goal. No bleeding noted. Noted plan to d/c home today.  Goal of Therapy:  INR 1.5 -2 per Dr. Wadie Lessen note   Plan:  1. Noted plan to d/c home today on 5mg  daily with INR follow-up.  Christoper Fabian, PharmD, BCPS Clinical pharmacist, pager [phone] 08/14/2011,11:22 AM

## 2011-08-14 NOTE — Progress Notes (Signed)
Utilization review completed. Curlie Macken, RN, BSN. 08/14/11  

## 2011-08-25 DIAGNOSIS — J40 Bronchitis, not specified as acute or chronic: Secondary | ICD-10-CM

## 2011-08-25 HISTORY — DX: Bronchitis, not specified as acute or chronic: J40

## 2011-08-28 ENCOUNTER — Other Ambulatory Visit: Payer: Self-pay | Admitting: Orthopedic Surgery

## 2011-09-15 ENCOUNTER — Encounter (HOSPITAL_COMMUNITY): Payer: Self-pay

## 2011-09-15 ENCOUNTER — Encounter (HOSPITAL_COMMUNITY)
Admission: RE | Admit: 2011-09-15 | Discharge: 2011-09-15 | Disposition: A | Discharge status: Home / Self Care | Payer: BC Managed Care – PPO | Source: Ambulatory Visit | Attending: Orthopedic Surgery | Admitting: Orthopedic Surgery

## 2011-09-15 LAB — CBC
HCT: 40.1 % (ref 36.0–46.0)
MCH: 32.5 pg (ref 26.0–34.0)
MCV: 95.9 fL (ref 78.0–100.0)
Platelets: 264 10*3/uL (ref 150–400)
RDW: 14.4 % (ref 11.5–15.5)
WBC: 13.5 10*3/uL — ABNORMAL HIGH (ref 4.0–10.5)

## 2011-09-15 LAB — DIFFERENTIAL
Basophils Absolute: 0.1 10*3/uL (ref 0.0–0.1)
Eosinophils Absolute: 0.5 10*3/uL (ref 0.0–0.7)
Eosinophils Relative: 3 % (ref 0–5)
Lymphocytes Relative: 31 % (ref 12–46)
Lymphs Abs: 4.2 10*3/uL — ABNORMAL HIGH (ref 0.7–4.0)
Monocytes Absolute: 0.8 10*3/uL (ref 0.1–1.0)

## 2011-09-15 LAB — SURGICAL PCR SCREEN
MRSA, PCR: NEGATIVE
Staphylococcus aureus: NEGATIVE

## 2011-09-15 LAB — TYPE AND SCREEN: ABO/RH(D): A POS

## 2011-09-15 LAB — URINALYSIS, ROUTINE W REFLEX MICROSCOPIC
Bilirubin Urine: NEGATIVE
Glucose, UA: NEGATIVE mg/dL
Hgb urine dipstick: NEGATIVE
Specific Gravity, Urine: 1.011 (ref 1.005–1.030)

## 2011-09-15 LAB — BASIC METABOLIC PANEL
BUN: 23 mg/dL (ref 6–23)
CO2: 30 mEq/L (ref 19–32)
Calcium: 10.1 mg/dL (ref 8.4–10.5)
Chloride: 94 mEq/L — ABNORMAL LOW (ref 96–112)
Creatinine, Ser: 1.27 mg/dL — ABNORMAL HIGH (ref 0.50–1.10)
Glucose, Bld: 113 mg/dL — ABNORMAL HIGH (ref 70–99)

## 2011-09-15 LAB — URINE MICROSCOPIC-ADD ON

## 2011-09-15 LAB — APTT: aPTT: 24 seconds (ref 24–37)

## 2011-09-15 MED ORDER — CHLORHEXIDINE GLUCONATE 4 % EX LIQD: 60.0000 mL | Freq: Once | CUTANEOUS | Status: DC

## 2011-09-15 NOTE — Pre-Procedure Instructions (Signed)
20 Valerie Moore  09/15/2011   Your procedure is scheduled on:  09/22/2011 Monday  Report to [facility] at 0750 AM.  Call this number if you have problems the morning of surgery: [phone]   Remember:   Do not eat food:After Midnight.  May have clear liquids: up to 4 Hours before arrival  (0350)   Do not drink liquids after 0350 am the day of surgery. .  Clear liquids include soda, tea, black coffee, apple or grape juice, broth.  Take these medicines the morning of surgery with A SIP OF WATER: atenolol  Nebulizer if needed  protonix   Do not wear jewelry, make-up or nail polish.  Do not wear lotions, powders, or perfumes. You may wear deodorant.  Do not shave 48 hours prior to surgery.  Do not bring valuables to the hospital.  Contacts, dentures or bridgework may not be worn into surgery.  Leave suitcase in the car. After surgery it may be brought to your room.  For patients admitted to the hospital, checkout time is 11:00 AM the day of discharge.   Patients discharged the day of surgery will not be allowed to drive home.  Name and phone number of your driver: Trey Paula- husband [phone]  Special Instructions: CHG Shower Use Special Wash: 1/2 bottle night before surgery and 1/2 bottle morning of surgery.   Please read over the following fact sheets that you were given: Pain Booklet, Coughing and Deep Breathing, Blood Transfusion Information, MRSA Information and Surgical Site Infection Prevention

## 2011-09-17 NOTE — H&P (Signed)
  HPI: Valerie Moore is status post-right total knee 11 August 2011 doing very well, range of motion is 0-109, she does need a refill of her Percocet.  She denies any fevers chills or wound problems and is ready for staple removal.  She would also like to schedule her left total knee in about a month, this is reasonable.  Overall, she uses one Percocet every 6-8 hours, she is able sleep at night.  All: She is allergic to penicillin and shellfish   ROS: 14 point review of systems form filled out by the patient was reviewed and was negative as it relates to the history of present illness except for: Glasses, hoarseness, cough, depression.  PMH: Hypertension, frequent UTIs, decreased kidney function, sleep apnea, acid reflux, gout, high cholesterol, hypothyroidism.  FHx: Hypertension and arthritis  SocHx:  Denies tobacco.  Drinks occasional alcohol.  She works as an Environmental health practitioner at [employer].  PE: Well-developed and well-nourished 51 year old female who is alert and oriented in no acute distress.  Feet 9 inches tall, 204 pounds.  Examination of the left knee demonstrates a fairly compelling patellofemoral crepitance.  she is nontender to palpation along the joint lines. The ligaments are globally stable.  Right knee has well-healed surgical incision.  She is neurovascularly intact.  Asses: 1. S/p right TKA 2. end-stage patellofemoral arthritis of left knee  Plan:.  Valerie Moore is well aware of the risks and benefits of knee replacement.  She would like to go ahead and proceed with Left TKA.

## 2011-09-21 MED ORDER — VANCOMYCIN HCL 1000 MG IV SOLR
1500.0000 mg | INTRAVENOUS | Status: AC
  Administered 2011-09-22: 1500 mg via INTRAVENOUS
  Filled 2011-09-21: qty 1500

## 2011-09-21 MED ORDER — DEXTROSE-NACL 5-0.45 % IV SOLN: INTRAVENOUS | Status: DC

## 2011-09-22 ENCOUNTER — Ambulatory Visit (HOSPITAL_COMMUNITY): Payer: BC Managed Care – PPO | Admitting: Anesthesiology

## 2011-09-22 ENCOUNTER — Encounter (HOSPITAL_COMMUNITY): Payer: Self-pay | Admitting: Surgery

## 2011-09-22 ENCOUNTER — Encounter (HOSPITAL_COMMUNITY): Payer: Self-pay | Admitting: Anesthesiology

## 2011-09-22 ENCOUNTER — Inpatient Hospital Stay (HOSPITAL_COMMUNITY)
Admission: RE | Admit: 2011-09-22 | Discharge: 2011-09-24 | DRG: 209 | Disposition: A | Discharge status: Home Health | Payer: BC Managed Care – PPO | Source: Ambulatory Visit | Attending: Orthopedic Surgery | Admitting: Orthopedic Surgery

## 2011-09-22 ENCOUNTER — Encounter (HOSPITAL_COMMUNITY)
Admission: RE | Disposition: A | Discharge status: Home Health | Payer: Self-pay | Source: Ambulatory Visit | Attending: Orthopedic Surgery

## 2011-09-22 DIAGNOSIS — M109 Gout, unspecified: Secondary | ICD-10-CM | POA: Diagnosis present

## 2011-09-22 DIAGNOSIS — G4730 Sleep apnea, unspecified: Secondary | ICD-10-CM | POA: Diagnosis present

## 2011-09-22 DIAGNOSIS — Z01812 Encounter for preprocedural laboratory examination: Secondary | ICD-10-CM

## 2011-09-22 DIAGNOSIS — F329 Major depressive disorder, single episode, unspecified: Secondary | ICD-10-CM | POA: Diagnosis present

## 2011-09-22 DIAGNOSIS — I10 Essential (primary) hypertension: Secondary | ICD-10-CM | POA: Diagnosis present

## 2011-09-22 DIAGNOSIS — M1710 Unilateral primary osteoarthritis, unspecified knee: Principal | ICD-10-CM | POA: Diagnosis present

## 2011-09-22 DIAGNOSIS — K219 Gastro-esophageal reflux disease without esophagitis: Secondary | ICD-10-CM | POA: Diagnosis present

## 2011-09-22 DIAGNOSIS — E039 Hypothyroidism, unspecified: Secondary | ICD-10-CM | POA: Diagnosis present

## 2011-09-22 DIAGNOSIS — E7800 Pure hypercholesterolemia, unspecified: Secondary | ICD-10-CM | POA: Diagnosis present

## 2011-09-22 DIAGNOSIS — M1712 Unilateral primary osteoarthritis, left knee: Secondary | ICD-10-CM

## 2011-09-22 DIAGNOSIS — F3289 Other specified depressive episodes: Secondary | ICD-10-CM | POA: Diagnosis present

## 2011-09-22 DIAGNOSIS — Z01818 Encounter for other preprocedural examination: Secondary | ICD-10-CM

## 2011-09-22 HISTORY — PX: TOTAL KNEE ARTHROPLASTY: SHX125

## 2011-09-22 SURGERY — ARTHROPLASTY, KNEE, TOTAL
Anesthesia: General | Site: Knee | Laterality: Left | Wound class: Clean

## 2011-09-22 MED ORDER — NITROFURANTOIN MONOHYD MACRO 100 MG PO CAPS
100.0000 mg | ORAL_CAPSULE | Freq: Every day | ORAL | Status: DC | PRN
  Filled 2011-09-22: qty 1

## 2011-09-22 MED ORDER — DROPERIDOL 2.5 MG/ML IJ SOLN
INTRAMUSCULAR | Status: DC | PRN
  Administered 2011-09-22: 0.625 mg via INTRAVENOUS

## 2011-09-22 MED ORDER — HYDROMORPHONE HCL PF 1 MG/ML IJ SOLN: 0.2500 mg | INTRAMUSCULAR | Status: DC | PRN

## 2011-09-22 MED ORDER — KCL IN DEXTROSE-NACL 20-5-0.45 MEQ/L-%-% IV SOLN
INTRAVENOUS | Status: DC
  Administered 2011-09-22 – 2011-09-24 (×5): via INTRAVENOUS
  Filled 2011-09-22 (×11): qty 1000

## 2011-09-22 MED ORDER — MIDAZOLAM HCL 2 MG/2ML IJ SOLN
INTRAMUSCULAR | Status: AC
  Filled 2011-09-22: qty 2

## 2011-09-22 MED ORDER — MODAFINIL 200 MG PO TABS: 200.0000 mg | ORAL_TABLET | Freq: Every day | ORAL | Status: DC

## 2011-09-22 MED ORDER — MENTHOL 3 MG MT LOZG
1.0000 | LOZENGE | OROMUCOSAL | Status: DC | PRN
  Administered 2011-09-24: 3 mg via ORAL
  Filled 2011-09-22: qty 9

## 2011-09-22 MED ORDER — KETOROLAC TROMETHAMINE 30 MG/ML IJ SOLN
30.0000 mg | Freq: Four times a day (QID) | INTRAMUSCULAR | Status: DC | PRN
  Administered 2011-09-23: 30 mg via INTRAVENOUS
  Filled 2011-09-22: qty 1

## 2011-09-22 MED ORDER — PHENOL 1.4 % MT LIQD: 1.0000 | OROMUCOSAL | Status: DC | PRN

## 2011-09-22 MED ORDER — FUROSEMIDE 80 MG PO TABS
80.0000 mg | ORAL_TABLET | Freq: Every day | ORAL | Status: DC | PRN
  Filled 2011-09-22: qty 1

## 2011-09-22 MED ORDER — HYDROMORPHONE HCL PF 1 MG/ML IJ SOLN
0.2500 mg | INTRAMUSCULAR | Status: DC | PRN
  Administered 2011-09-22 (×2): 0.5 mg via INTRAVENOUS

## 2011-09-22 MED ORDER — ACETAMINOPHEN 10 MG/ML IV SOLN
INTRAVENOUS | Status: DC | PRN
  Administered 2011-09-22: 1000 mg via INTRAVENOUS

## 2011-09-22 MED ORDER — ATENOLOL 50 MG PO TABS
50.0000 mg | ORAL_TABLET | ORAL | Status: DC
  Administered 2011-09-23: 50 mg via ORAL
  Filled 2011-09-22 (×3): qty 1

## 2011-09-22 MED ORDER — SIMVASTATIN 20 MG PO TABS: 20.0000 mg | ORAL_TABLET | Freq: Every day | ORAL | Status: DC

## 2011-09-22 MED ORDER — POTASSIUM CHLORIDE ER 10 MEQ PO TBCR
20.0000 meq | EXTENDED_RELEASE_TABLET | Freq: Every evening | ORAL | Status: DC
  Administered 2011-09-22 – 2011-09-23 (×2): 20 meq via ORAL
  Filled 2011-09-22 (×3): qty 2

## 2011-09-22 MED ORDER — AMITRIPTYLINE HCL 100 MG PO TABS
100.0000 mg | ORAL_TABLET | Freq: Every day | ORAL | Status: DC
  Administered 2011-09-22 – 2011-09-23 (×2): 100 mg via ORAL
  Filled 2011-09-22 (×3): qty 1

## 2011-09-22 MED ORDER — ZOLPIDEM TARTRATE 5 MG PO TABS: 5.0000 mg | ORAL_TABLET | Freq: Every evening | ORAL | Status: DC | PRN

## 2011-09-22 MED ORDER — GLYCOPYRROLATE 0.2 MG/ML IJ SOLN
INTRAMUSCULAR | Status: DC | PRN
  Administered 2011-09-22: .6 mg via INTRAVENOUS

## 2011-09-22 MED ORDER — ONDANSETRON HCL 4 MG/2ML IJ SOLN: 4.0000 mg | Freq: Once | INTRAMUSCULAR | Status: DC | PRN

## 2011-09-22 MED ORDER — LIDOCAINE HCL 4 % MT SOLN
OROMUCOSAL | Status: DC | PRN
  Administered 2011-09-22: 4 mL via TOPICAL

## 2011-09-22 MED ORDER — METOCLOPRAMIDE HCL 10 MG PO TABS: 5.0000 mg | ORAL_TABLET | Freq: Three times a day (TID) | ORAL | Status: DC | PRN

## 2011-09-22 MED ORDER — FENTANYL CITRATE 0.05 MG/ML IJ SOLN
INTRAMUSCULAR | Status: DC | PRN
  Administered 2011-09-22 (×4): 50 ug via INTRAVENOUS
  Administered 2011-09-22: 100 ug via INTRAVENOUS
  Administered 2011-09-22 (×2): 25 ug via INTRAVENOUS
  Administered 2011-09-22: 50 ug via INTRAVENOUS

## 2011-09-22 MED ORDER — ATORVASTATIN CALCIUM 20 MG PO TABS
20.0000 mg | ORAL_TABLET | Freq: Every day | ORAL | Status: DC
  Administered 2011-09-22 – 2011-09-23 (×2): 20 mg via ORAL
  Filled 2011-09-22 (×3): qty 1

## 2011-09-22 MED ORDER — COLCHICINE 0.6 MG PO TABS
0.6000 mg | ORAL_TABLET | Freq: Every day | ORAL | Status: DC
  Administered 2011-09-22 – 2011-09-23 (×2): 0.6 mg via ORAL
  Filled 2011-09-22 (×3): qty 1

## 2011-09-22 MED ORDER — CLONAZEPAM 0.5 MG PO TABS: 0.2500 mg | ORAL_TABLET | Freq: Every evening | ORAL | Status: DC | PRN

## 2011-09-22 MED ORDER — SUMATRIPTAN SUCCINATE 50 MG PO TABS
50.0000 mg | ORAL_TABLET | Freq: Once | ORAL | Status: AC
  Administered 2011-09-22: 50 mg via ORAL
  Filled 2011-09-22: qty 1

## 2011-09-22 MED ORDER — DEXTROSE 5 % IV SOLN
500.0000 mg | Freq: Four times a day (QID) | INTRAVENOUS | Status: DC | PRN
  Administered 2011-09-22: 500 mg via INTRAVENOUS
  Filled 2011-09-22: qty 5

## 2011-09-22 MED ORDER — ONDANSETRON HCL 4 MG/2ML IJ SOLN: 4.0000 mg | Freq: Four times a day (QID) | INTRAMUSCULAR | Status: DC | PRN

## 2011-09-22 MED ORDER — DIPHENHYDRAMINE HCL 12.5 MG/5ML PO ELIX: 12.5000 mg | ORAL_SOLUTION | ORAL | Status: DC | PRN

## 2011-09-22 MED ORDER — LIDOCAINE HCL (CARDIAC) 20 MG/ML IV SOLN
INTRAVENOUS | Status: DC | PRN
  Administered 2011-09-22: 50 mg via INTRAVENOUS

## 2011-09-22 MED ORDER — WARFARIN - PHARMACIST DOSING INPATIENT
Freq: Every day | Status: DC
  Administered 2011-09-22: 18:00:00

## 2011-09-22 MED ORDER — FLEET ENEMA 7-19 GM/118ML RE ENEM: 1.0000 | ENEMA | Freq: Once | RECTAL | Status: AC | PRN

## 2011-09-22 MED ORDER — ALUM & MAG HYDROXIDE-SIMETH 200-200-20 MG/5ML PO SUSP: 30.0000 mL | ORAL | Status: DC | PRN

## 2011-09-22 MED ORDER — KCL IN DEXTROSE-NACL 20-5-0.45 MEQ/L-%-% IV SOLN
INTRAVENOUS | Status: AC
  Filled 2011-09-22: qty 1000

## 2011-09-22 MED ORDER — ACETAMINOPHEN 10 MG/ML IV SOLN
INTRAVENOUS | Status: AC
  Filled 2011-09-22: qty 100

## 2011-09-22 MED ORDER — HYDROCODONE-ACETAMINOPHEN 5-325 MG PO TABS: 1.0000 | ORAL_TABLET | ORAL | Status: DC | PRN

## 2011-09-22 MED ORDER — LEVOTHYROXINE SODIUM 88 MCG PO TABS
88.0000 ug | ORAL_TABLET | Freq: Every day | ORAL | Status: DC
  Administered 2011-09-22 – 2011-09-23 (×2): 88 ug via ORAL
  Filled 2011-09-22 (×3): qty 1

## 2011-09-22 MED ORDER — HYDROMORPHONE HCL PF 1 MG/ML IJ SOLN
0.5000 mg | INTRAMUSCULAR | Status: DC | PRN
  Administered 2011-09-22 – 2011-09-23 (×7): 1 mg via INTRAVENOUS
  Filled 2011-09-22 (×7): qty 1

## 2011-09-22 MED ORDER — ALLOPURINOL 300 MG PO TABS
600.0000 mg | ORAL_TABLET | Freq: Every day | ORAL | Status: DC
  Administered 2011-09-22 – 2011-09-23 (×2): 600 mg via ORAL
  Filled 2011-09-22 (×3): qty 2

## 2011-09-22 MED ORDER — METOCLOPRAMIDE HCL 5 MG/ML IJ SOLN: 5.0000 mg | Freq: Three times a day (TID) | INTRAMUSCULAR | Status: DC | PRN

## 2011-09-22 MED ORDER — MAGNESIUM HYDROXIDE 400 MG/5ML PO SUSP: 30.0000 mL | Freq: Every day | ORAL | Status: DC | PRN

## 2011-09-22 MED ORDER — NEOSTIGMINE METHYLSULFATE 1 MG/ML IJ SOLN
INTRAMUSCULAR | Status: DC | PRN
  Administered 2011-09-22: 4 mg via INTRAVENOUS

## 2011-09-22 MED ORDER — LACTATED RINGERS IV SOLN
INTRAVENOUS | Status: DC
  Administered 2011-09-22: 10:00:00 via INTRAVENOUS

## 2011-09-22 MED ORDER — BISACODYL 10 MG RE SUPP: 10.0000 mg | Freq: Every day | RECTAL | Status: DC | PRN

## 2011-09-22 MED ORDER — ONDANSETRON HCL 4 MG/2ML IJ SOLN
INTRAMUSCULAR | Status: DC | PRN
  Administered 2011-09-22: 4 mg via INTRAVENOUS

## 2011-09-22 MED ORDER — FENTANYL CITRATE 0.05 MG/ML IJ SOLN
50.0000 ug | INTRAMUSCULAR | Status: DC | PRN
  Administered 2011-09-22: 100 ug via INTRAVENOUS

## 2011-09-22 MED ORDER — ADULT MULTIVITAMIN W/MINERALS CH
1.0000 | ORAL_TABLET | Freq: Every day | ORAL | Status: DC
  Administered 2011-09-23 – 2011-09-24 (×2): 1 via ORAL
  Filled 2011-09-22 (×3): qty 1

## 2011-09-22 MED ORDER — LEVALBUTEROL HCL 1.25 MG/0.5ML IN NEBU
1.2500 mg | INHALATION_SOLUTION | RESPIRATORY_TRACT | Status: DC | PRN
  Filled 2011-09-22: qty 0.5

## 2011-09-22 MED ORDER — ONDANSETRON HCL 4 MG PO TABS: 4.0000 mg | ORAL_TABLET | Freq: Four times a day (QID) | ORAL | Status: DC | PRN

## 2011-09-22 MED ORDER — PANTOPRAZOLE SODIUM 40 MG PO TBEC
40.0000 mg | DELAYED_RELEASE_TABLET | Freq: Two times a day (BID) | ORAL | Status: DC
  Administered 2011-09-22 – 2011-09-24 (×4): 40 mg via ORAL
  Filled 2011-09-22 (×4): qty 1

## 2011-09-22 MED ORDER — WARFARIN SODIUM 7.5 MG PO TABS
7.5000 mg | ORAL_TABLET | Freq: Once | ORAL | Status: AC
  Administered 2011-09-22: 7.5 mg via ORAL
  Filled 2011-09-22: qty 1

## 2011-09-22 MED ORDER — ACETAMINOPHEN 650 MG RE SUPP: 650.0000 mg | Freq: Four times a day (QID) | RECTAL | Status: DC | PRN

## 2011-09-22 MED ORDER — MIDAZOLAM HCL 2 MG/2ML IJ SOLN
1.0000 mg | INTRAMUSCULAR | Status: DC | PRN
  Administered 2011-09-22: 2 mg via INTRAVENOUS

## 2011-09-22 MED ORDER — FENTANYL CITRATE 0.05 MG/ML IJ SOLN
INTRAMUSCULAR | Status: AC
  Filled 2011-09-22: qty 2

## 2011-09-22 MED ORDER — METHOCARBAMOL 500 MG PO TABS
500.0000 mg | ORAL_TABLET | Freq: Four times a day (QID) | ORAL | Status: DC | PRN
  Administered 2011-09-22 – 2011-09-24 (×4): 500 mg via ORAL
  Filled 2011-09-22 (×6): qty 1

## 2011-09-22 MED ORDER — PROPOFOL 10 MG/ML IV EMUL
INTRAVENOUS | Status: DC | PRN
  Administered 2011-09-22: 150 mg via INTRAVENOUS

## 2011-09-22 MED ORDER — SODIUM CHLORIDE 0.9 % IR SOLN
Status: DC | PRN
  Administered 2011-09-22: 3000 mL

## 2011-09-22 MED ORDER — ROCURONIUM BROMIDE 100 MG/10ML IV SOLN
INTRAVENOUS | Status: DC | PRN
  Administered 2011-09-22: 40 mg via INTRAVENOUS

## 2011-09-22 MED ORDER — DULOXETINE HCL 60 MG PO CPEP
90.0000 mg | ORAL_CAPSULE | Freq: Every day | ORAL | Status: DC
  Administered 2011-09-22 – 2011-09-23 (×2): 90 mg via ORAL
  Filled 2011-09-22 (×3): qty 1

## 2011-09-22 MED ORDER — OXYCODONE-ACETAMINOPHEN 5-325 MG PO TABS
1.0000 | ORAL_TABLET | ORAL | Status: DC | PRN
  Administered 2011-09-22 – 2011-09-23 (×3): 2 via ORAL
  Administered 2011-09-23: 1 via ORAL
  Administered 2011-09-23 – 2011-09-24 (×5): 2 via ORAL
  Filled 2011-09-22: qty 1
  Filled 2011-09-22 (×9): qty 2

## 2011-09-22 MED ORDER — LACTATED RINGERS IV SOLN
INTRAVENOUS | Status: DC | PRN
  Administered 2011-09-22 (×2): via INTRAVENOUS

## 2011-09-22 MED ORDER — ACETAMINOPHEN 325 MG PO TABS: 650.0000 mg | ORAL_TABLET | Freq: Four times a day (QID) | ORAL | Status: DC | PRN

## 2011-09-22 SURGICAL SUPPLY — 54 items
BANDAGE ELASTIC 6 VELCRO ST LF (GAUZE/BANDAGES/DRESSINGS) ×2 IMPLANT
BANDAGE ESMARK 6X9 LF (GAUZE/BANDAGES/DRESSINGS) ×1 IMPLANT
BLADE SAG 18X100X1.27 (BLADE) ×2 IMPLANT
BLADE SAW SGTL 13X75X1.27 (BLADE) ×2 IMPLANT
BLADE SURG ROTATE 9660 (MISCELLANEOUS) IMPLANT
BNDG ELASTIC 6X10 VLCR STRL LF (GAUZE/BANDAGES/DRESSINGS) ×2 IMPLANT
BNDG ESMARK 6X9 LF (GAUZE/BANDAGES/DRESSINGS) ×2
BOWL SMART MIX CTS (DISPOSABLE) ×2 IMPLANT
CEMENT HV SMART SET (Cement) ×4 IMPLANT
CLOTH BEACON ORANGE TIMEOUT ST (SAFETY) ×2 IMPLANT
COVER BACK TABLE 24X17X13 BIG (DRAPES) IMPLANT
COVER SURGICAL LIGHT HANDLE (MISCELLANEOUS) ×2 IMPLANT
CUFF TOURNIQUET SINGLE 34IN LL (TOURNIQUET CUFF) IMPLANT
CUFF TOURNIQUET SINGLE 44IN (TOURNIQUET CUFF) IMPLANT
DRAPE EXTREMITY T 121X128X90 (DRAPE) ×2 IMPLANT
DRAPE U-SHAPE 47X51 STRL (DRAPES) ×2 IMPLANT
DURAPREP 26ML APPLICATOR (WOUND CARE) ×2 IMPLANT
ELECT REM PT RETURN 9FT ADLT (ELECTROSURGICAL) ×2
ELECTRODE REM PT RTRN 9FT ADLT (ELECTROSURGICAL) ×1 IMPLANT
EVACUATOR 1/8 PVC DRAIN (DRAIN) ×2 IMPLANT
GAUZE XEROFORM 1X8 LF (GAUZE/BANDAGES/DRESSINGS) ×2 IMPLANT
GAUZE XEROFORM 5X9 LF (GAUZE/BANDAGES/DRESSINGS) ×2 IMPLANT
GLOVE BIO SURGEON STRL SZ7 (GLOVE) ×2 IMPLANT
GLOVE BIO SURGEON STRL SZ7.5 (GLOVE) ×2 IMPLANT
GLOVE BIOGEL PI IND STRL 7.0 (GLOVE) ×1 IMPLANT
GLOVE BIOGEL PI IND STRL 8 (GLOVE) ×1 IMPLANT
GLOVE BIOGEL PI INDICATOR 7.0 (GLOVE) ×1
GLOVE BIOGEL PI INDICATOR 8 (GLOVE) ×1
GOWN PREVENTION PLUS XLARGE (GOWN DISPOSABLE) ×2 IMPLANT
GOWN STRL NON-REIN LRG LVL3 (GOWN DISPOSABLE) ×4 IMPLANT
HANDPIECE INTERPULSE COAX TIP (DISPOSABLE) ×1
HOOD PEEL AWAY FACE SHEILD DIS (HOOD) ×6 IMPLANT
KIT BASIN OR (CUSTOM PROCEDURE TRAY) ×2 IMPLANT
KIT ROOM TURNOVER OR (KITS) ×2 IMPLANT
MANIFOLD NEPTUNE II (INSTRUMENTS) ×2 IMPLANT
NS IRRIG 1000ML POUR BTL (IV SOLUTION) ×2 IMPLANT
PACK TOTAL JOINT (CUSTOM PROCEDURE TRAY) ×2 IMPLANT
PAD ARMBOARD 7.5X6 YLW CONV (MISCELLANEOUS) ×4 IMPLANT
PADDING CAST COTTON 6X4 STRL (CAST SUPPLIES) ×2 IMPLANT
SET HNDPC FAN SPRY TIP SCT (DISPOSABLE) ×1 IMPLANT
SPONGE GAUZE 4X4 12PLY (GAUZE/BANDAGES/DRESSINGS) ×2 IMPLANT
STAPLER VISISTAT 35W (STAPLE) ×2 IMPLANT
SUCTION FRAZIER TIP 10 FR DISP (SUCTIONS) ×2 IMPLANT
SURGIFLO TRUKIT (HEMOSTASIS) IMPLANT
SUT VIC AB 0 CTX 36 (SUTURE) ×1
SUT VIC AB 0 CTX36XBRD ANTBCTR (SUTURE) ×1 IMPLANT
SUT VIC AB 1 CTX 36 (SUTURE) ×1
SUT VIC AB 1 CTX36XBRD ANBCTR (SUTURE) ×1 IMPLANT
SUT VIC AB 2-0 CT1 27 (SUTURE) ×1
SUT VIC AB 2-0 CT1 TAPERPNT 27 (SUTURE) ×1 IMPLANT
TOWEL OR 17X24 6PK STRL BLUE (TOWEL DISPOSABLE) ×2 IMPLANT
TOWEL OR 17X26 10 PK STRL BLUE (TOWEL DISPOSABLE) ×2 IMPLANT
TRAY FOLEY CATH 14FR (SET/KITS/TRAYS/PACK) IMPLANT
WATER STERILE IRR 1000ML POUR (IV SOLUTION) ×6 IMPLANT

## 2011-09-22 NOTE — Preoperative (Signed)
Beta Blockers   Reason not to administer Beta Blockers:Not Applicable 

## 2011-09-22 NOTE — Progress Notes (Signed)
Orthopedic Tech Progress Note Patient Details:  Valerie Moore Nov 30, 1960 [id number]  CPM Left Knee CPM Left Knee: On Left Knee Flexion (Degrees): 30  Left Knee Extension (Degrees): 0    Shawnie Pons 09/22/2011, 2:36 PM

## 2011-09-22 NOTE — Anesthesia Postprocedure Evaluation (Signed)
  Anesthesia Post-op Note  Patient: Valerie Moore  Procedure(s) Performed: Procedure(s) (LRB): TOTAL KNEE ARTHROPLASTY (Left)  Patient Location: PACU  Anesthesia Type: General  Level of Consciousness: awake, alert  and oriented  Airway and Oxygen Therapy: Patient Spontanous Breathing and Patient connected to nasal cannula oxygen  Post-op Pain: mild  Post-op Assessment: Post-op Vital signs reviewed  Post-op Vital Signs: stable  Complications: No apparent anesthesia complications

## 2011-09-22 NOTE — Op Note (Signed)
PATIENT ID:      Valerie Moore  MRN:     [id number] DOB/AGE:    [birth year] / 52 y.o.       OPERATIVE REPORT    DATE OF PROCEDURE:  09/22/2011       PREOPERATIVE DIAGNOSIS:   OSTEOARTHRITIS LEFT KNEE      Estimated Body mass index is 30.55 kg/(m^2) as calculated from the following:   Height as of 08/04/11: 5\' 9" (1.753 m).   Weight as of 08/04/11: 206 lb 14.4 oz(93.849 kg).                                                        POSTOPERATIVE DIAGNOSIS:   OSTEOARTHRITIS LEFT KNEE                                                                      PROCEDURE:  Procedure(s): TOTAL KNEE ARTHROPLASTY Using Depuy Sigma RP implants #3L Femur, #3Tibia, 10mm sigma RP bearing, 38 Patella     SURGEON: Joyce Leckey J    ASSISTANT:   Shirl Harris PA-C   (Present and scrubbed throughout the case, critical for assistance with exposure, retraction, instrumentation, and closure.)         ANESTHESIA: GET with Femoral Nerve Block  DRAINS: foley, 2 medium hemovac in knee   TOURNIQUET TIME:   COMPLICATIONS:  None     SPECIMENS: None   INDICATIONS FOR PROCEDURE: The patient has  OSTEOARTHRITIS LEFT KNEE, 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 Patient has failed all conservative measures including anti-inflammatory medicines, narcotics, attempts at  exercise and weight loss, cortisone injections and viscosupplementation.  Risks and benefits of surgery have been discussed, questions answered.   DESCRIPTION OF PROCEDURE: The patient identified by armband, received  right femoral nerve block and IV antibiotics, in the holding area at [facility]. Patient taken to the operating room, appropriate anesthetic  monitors were attached General endotracheal anesthesia induced with  the patient in supine position, Foley catheter was inserted. Tourniquet  applied high to the operative thigh. Lateral post and foot positioner  applied to the table, the lower  extremity was then prepped and draped  in usual sterile fashion from the ankle to the tourniquet. Time-out procedure was performed. The limb was wrapped with an Esmarch bandage and the tourniquet inflated to 350 mmHg. We began the operation by making the anterior midline incision starting at handbreadth above the patella going over the patella 1 cm medial to and  4 cm distal to the tibial tubercle. Small bleeders in the skin and the  subcutaneous tissue identified and cauterized. Transverse retinaculum was incised and reflected medially and a medial parapatellar arthrotomy was accomplished. the patella was everted and theprepatellar fat pad resected. The superficial medial collateral  ligament was then elevated from anterior to posterior along the proximal  flare of the tibia and anterior half of the menisci resected. The knee was hyperflexed exposing bone on bone arthritis. Peripheral and notch osteophytes as well as the cruciate ligaments were then  resected. We continued to  work our way around posteriorly along the proximal tibia, and externally  rotated the tibia subluxing it out from underneath the femur. A McHale  retractor was placed through the notch and a lateral Hohmann retractor  placed, and we then drilled through the proximal tibia in line with the  axis of the tibia followed by an intramedullary guide rod and 2-degree  posterior slope cutting guide. The tibial cutting guide was pinned into place  allowing resection of 9 mm of bone medially and about 9 mm of bone  laterally because of her varus deformity. Satisfied with the tibial resection, we then  entered the distal femur 2 mm anterior to the PCL origin with the  intramedullary guide rod and applied the distal femoral cutting guide  set at 11mm, with 5 degrees of valgus. This was pinned along the  epicondylar axis. At this point, the distal femoral cut was accomplished without difficulty. We then sized for a #3L femoral component and  pinned the guide in 0 degrees of external rotation.The chamfer cutting guide was pinned into place. The anterior, posterior, and chamfer cuts were accomplished without difficulty followed by  the Sigma RP box cutting guide and the box cut. We also removed posterior osteophytes from the posterior femoral condyles. At this  time, the knee was brought into full extension. We checked our  extension and flexion gaps and found them symmetric at 10mm.  The patella thickness measured at 20 mm. We set the cutting guide at 11 and removed the posterior 9.5-10 mm  of the patella sized for 38 button and drilled the lollipop. The knee  was then once again hyperflexed exposing the proximal tibia. We sized for a #3 tibial base plate, applied the smokestack and the conical reamer followed by the the Delta fin keel punch. We then hammered into place the Sigma RP trial femoral component, inserted a 10-mm trial bearing, trial patellar button, and took the knee through range of motion from 0-130 degrees. No thumb pressure was required for patellar  tracking. At this point, all trial components were removed, a double batch of DePuy HV cement with 1500 mg of Zinacef was mixed and applied to all bony metallic mating surfaces except for the posterior condyles of the femur itself. In order, we  hammered into place the tibial tray and removed excess cement, the femoral component and removed excess cement, a 10-mm Sigma RP bearing  was inserted, and the knee brought to full extension with compression.  The patellar button was clamped into place, and excess cement  removed. While the cement cured the wound was irrigated out with normal saline solution pulse lavage, and medium Hemovac drains were placed from an anterolateral  approach. Ligament stability and patellar tracking were checked and found to be excellent. The parapatellar arthrotomy was closed with  running #1 Vicryl suture. The subcutaneous tissue with 0 and 2-0 undyed    Vicryl suture, and the skin with skin staples. A dressing of Xeroform,  4 x 4, dressing sponges, Webril, and Ace wrap applied. The patient  awakened, extubated, and taken to recovery room without difficulty.   Nkenge Sonntag J 09/22/2011, 11:22 AM

## 2011-09-22 NOTE — Anesthesia Procedure Notes (Addendum)
Anesthesia Regional Block:    Pre-Anesthetic Checklist: ,, timeout performed, Correct Patient, Correct Site, Correct Laterality, Correct Procedure, Correct Position, site marked, Risks and benefits discussed,  Surgical consent,  Pre-op evaluation,  At surgeon's request and post-op pain management  Laterality: Left  Prep: chloraprep       Needles:  Injection technique: Single-shot  Needle Type: Echogenic Stimulator Needle      Needle Gauge: 22 and 22 G    Additional Needles:  Procedures: ultrasound guided  Narrative:  Start time: 09/22/2011 9:20 AM End time: 09/22/2011 9:30 AM  Performed by: Personally   Additional Notes: L. Mid-thigh saphenous block done under US guidance. 30 cc 0.5% marcaine with 1:200 Epi injected adductor canal.  Kipp Brood, MD   Procedure Name: Intubation Date/Time: 09/22/2011 10:06 AM Performed by: Elizbeth Squires R Pre-anesthesia Checklist: Patient identified, Emergency Drugs available, Suction available and Patient being monitored Patient Re-evaluated:Patient Re-evaluated prior to inductionOxygen Delivery Method: Circle system utilized Preoxygenation: Pre-oxygenation with 100% oxygen Intubation Type: IV induction Ventilation: Mask ventilation without difficulty Laryngoscope Size: Mac and 3 Grade View: Grade II Tube type: Oral Tube size: 7.0 mm Number of attempts: 1 Airway Equipment and Method: Stylet Placement Confirmation: ETT inserted through vocal cords under direct vision,  positive ETCO2 and breath sounds checked- equal and bilateral Secured at: 20 cm Tube secured with: Tape Dental Injury: Teeth and Oropharynx as per pre-operative assessment

## 2011-09-22 NOTE — Progress Notes (Signed)
ANTICOAGULATION CONSULT NOTE - Initial Consult  Pharmacy Consult for Coumadin Indication: VTE prophylaxis  Allergies  Allergen Reactions  . Shellfish Allergy Swelling  . Penicillins Rash    Patient Measurements:   Heparin Dosing Weight: 87.9 kg  Vital Signs: Temp: 97.7 F (36.5 C) (04/29 1315) Temp src: Oral (04/29 0756) BP: 101/64 mmHg (04/29 1315) Pulse Rate: 95  (04/29 1315)  Labs: No results found for this basename: HGB:2,HCT:3,PLT:3,APTT:3,LABPROT:3,INR:3,HEPARINUNFRC:3,CREATININE:3,CKTOTAL:3,CKMB:3,TROPONINI:3 in the last 72 hours The CrCl is unknown because both a height and weight (above a minimum accepted value) are required for this calculation.  Medical History: Past Medical History  Diagnosis Date  . Gout   . Hypertension   . Hyperlipemia   . Hypothyroidism   . PONV (postoperative nausea and vomiting)   . Bronchitis 2013 April     bronchitis ... took a round of clarithromycin  and prednisone x 3 days.   . Depression   . GERD (gastroesophageal reflux disease)   . Headache   . Chronic kidney disease     no NSIADS/anitinflammatory    Medications:  Prescriptions prior to admission  Medication Sig Dispense Refill  . allopurinol (ZYLOPRIM) 300 MG tablet Take 600 mg by mouth at bedtime.      Marland Kitchen amitriptyline (ELAVIL) 100 MG tablet Take 100 mg by mouth at bedtime.      Marland Kitchen atenolol (TENORMIN) 50 MG tablet Take 50 mg by mouth every morning.      Marland Kitchen atorvastatin (LIPITOR) 20 MG tablet Take 20 mg by mouth every morning.      . clonazePAM (KLONOPIN) 0.5 MG tablet Take 0.25 mg by mouth at bedtime as needed. For sleep      . colchicine 0.6 MG tablet Take 0.6 mg by mouth at bedtime.      . DULoxetine (CYMBALTA) 30 MG capsule Take 90 mg by mouth at bedtime.      . furosemide (LASIX) 80 MG tablet Take 80 mg by mouth daily as needed. For swelling.      . levalbuterol (XOPENEX) 1.25 MG/0.5ML nebulizer solution Take 1 ampule by nebulization every 4 (four) hours as needed.  For shortness of breath/wheezing      . levothyroxine (SYNTHROID, LEVOTHROID) 88 MCG tablet Take 88 mcg by mouth at bedtime.      . modafinil (PROVIGIL) 200 MG tablet Take 200-400 mg by mouth daily.      . Multiple Vitamin (MULITIVITAMIN WITH MINERALS) TABS Take 1 tablet by mouth daily.      . nitrofurantoin, macrocrystal-monohydrate, (MACROBID) 100 MG capsule Take 100 mg by mouth daily as needed. After intercourse if pt feels an onset of infection (per pt) and/or once daily for a course of 7days      . pantoprazole (PROTONIX) 40 MG tablet Take 40 mg by mouth 2 (two) times daily.      . potassium chloride (K-DUR) 10 MEQ tablet Take 20 mEq by mouth every evening.       . rizatriptan (MAXALT) 5 MG tablet Take 5-10 mg by mouth 2 (two) times daily as needed. For migraine headaches      . VITAMIN B1-B12 IJ Inject 1,000 mcg as directed every 30 (thirty) days. On or around the 16 th of each month        Assessment: L TKA (s/p R TKA just 08/11/11). Baseline INR 1.06. CBC WNL. Coumadin points = 5 Coumadin education re-visited with patient and husband who seem very knowledgeable from last month's Coumadin dosing.  ID: WBC elevated at  13.5. Currently afebrile.   Cards: HTN, HLD: Atenolol, Lipitor  Nephr: Frequent UTI's, CRI with current Scr 1.27.  Endo: Gout, hypothyroid:  Meds: allopurinol, colchicine, levothyroxine  GI/Nutrition: GERD on PPI BID  Lytes: K only 3 on previous Bmet now on KDur 20/hs.  Neuro: Migraines: Elavil, Cymbalta, Provigil, Maxalt  Pulm: OSA  Goal of Therapy:  INR 2-3   Plan:  Coumadin 7.5mg  po x 1 Teaching done D/c Provigil per patient request since not on shift-work.  Merilynn Finland, [employer] 09/22/2011,1:37 PM

## 2011-09-22 NOTE — Transfer of Care (Signed)
Immediate Anesthesia Transfer of Care Note  Patient: Valerie Moore  Procedure(s) Performed: Procedure(s) (LRB): TOTAL KNEE ARTHROPLASTY (Left)  Patient Location: PACU  Anesthesia Type: General  Level of Consciousness: awake, alert  and oriented  Airway & Oxygen Therapy: Patient Spontanous Breathing and Patient connected to nasal cannula oxygen  Post-op Assessment: Report given to PACU RN and Post -op Vital signs reviewed and stable  Post vital signs: Reviewed and stable  Complications: No apparent anesthesia complications

## 2011-09-22 NOTE — Interval H&P Note (Signed)
History and Physical Interval Note:  09/22/2011 9:08 AM  Valerie Moore  has presented today for surgery, with the diagnosis of OSTEOARTHRITIS LEFT KNEE  The various methods of treatment have been discussed with the patient and family. After consideration of risks, benefits and other options for treatment, the patient has consented to  Procedure(s) (LRB): TOTAL KNEE ARTHROPLASTY (Left) as a surgical intervention .  The patients' history has been reviewed, patient examined, no change in status, stable for surgery.  I have reviewed the patients' chart and labs.  Questions were answered to the patient's satisfaction.     Nestor Lewandowsky

## 2011-09-22 NOTE — Anesthesia Preprocedure Evaluation (Addendum)
Anesthesia Evaluation  Patient identified by MRN, date of birth, ID band Patient awake    Reviewed: Allergy & Precautions, H&P , NPO status , Patient's Chart, lab work & pertinent test results  Airway Mallampati: III      Dental  (+) Teeth Intact   Pulmonary  breath sounds clear to auscultation        Cardiovascular Rhythm:Regular Rate:Normal     Neuro/Psych    GI/Hepatic   Endo/Other    Renal/GU      Musculoskeletal   Abdominal   Peds  Hematology   Anesthesia Other Findings   Reproductive/Obstetrics                           Anesthesia Physical Anesthesia Plan  ASA: III  Anesthesia Plan: General   Post-op Pain Management:    Induction: Intravenous  Airway Management Planned: LMA  Additional Equipment:   Intra-op Plan:   Post-operative Plan:   Informed Consent: I have reviewed the patients History and Physical, chart, labs and discussed the procedure including the risks, benefits and alternatives for the proposed anesthesia with the patient or authorized representative who has indicated his/her understanding and acceptance.   Dental advisory given  Plan Discussed with:   Anesthesia Plan Comments: (Htn GERD RAD DJD L.  Knee  Plan GA with FNB  Kipp Brood, MD)        Anesthesia Quick Evaluation

## 2011-09-23 ENCOUNTER — Encounter (HOSPITAL_COMMUNITY): Payer: Self-pay | Admitting: Orthopedic Surgery

## 2011-09-23 LAB — CBC
HCT: 29.5 % — ABNORMAL LOW (ref 36.0–46.0)
Hemoglobin: 9.6 g/dL — ABNORMAL LOW (ref 12.0–15.0)
MCH: 31.1 pg (ref 26.0–34.0)
MCHC: 32.5 g/dL (ref 30.0–36.0)
RDW: 14.2 % (ref 11.5–15.5)

## 2011-09-23 LAB — BASIC METABOLIC PANEL
BUN: 7 mg/dL (ref 6–23)
Calcium: 9.2 mg/dL (ref 8.4–10.5)
Creatinine, Ser: 1 mg/dL (ref 0.50–1.10)
GFR calc Af Amer: 74 mL/min — ABNORMAL LOW (ref >90)
GFR calc non Af Amer: 64 mL/min — ABNORMAL LOW (ref >90)
Glucose, Bld: 134 mg/dL — ABNORMAL HIGH (ref 70–99)

## 2011-09-23 MED ORDER — WARFARIN SODIUM 7.5 MG PO TABS
7.5000 mg | ORAL_TABLET | Freq: Once | ORAL | Status: AC
  Administered 2011-09-23: 7.5 mg via ORAL
  Filled 2011-09-23: qty 1

## 2011-09-23 MED ORDER — SODIUM CHLORIDE 0.9 % IV BOLUS (SEPSIS)
500.0000 mL | Freq: Once | INTRAVENOUS | Status: AC
  Administered 2011-09-23: 500 mL via INTRAVENOUS

## 2011-09-23 NOTE — Progress Notes (Signed)
UR COMPLETED  

## 2011-09-23 NOTE — Progress Notes (Signed)
Occupational Therapy Evaluation Patient Details Name: Valerie Moore MRN: [id number] DOB: February 20, 1961 Today's Date: 09/23/2011 Time: 4098-1191 OT Time Calculation (min): 25 min  OT Assessment / Plan / Recommendation Clinical Impression  51 yo s/p L TKA. S/p R TKA 6 wks ago. Pt has all DME and AE nec. No further OT services needed. Excellent progress.    OT Assessment  Patient does not need any further OT services    Follow Up Recommendations  No OT follow up    Equipment Recommendations  None recommended by OT    Frequency      Precautions / Restrictions Precautions Precautions: Knee Restrictions Weight Bearing Restrictions: Yes LLE Weight Bearing: Weight bearing as tolerated   Pertinent Vitals/Pain     ADL  Eating/Feeding: Simulated;Independent Where Assessed - Eating/Feeding: Chair Grooming: Performed;Modified independent Where Assessed - Grooming: Standing at sink Upper Body Bathing: Simulated;Modified independent Where Assessed - Upper Body Bathing: Standing at sink Lower Body Bathing: Simulated;Set up Where Assessed - Lower Body Bathing: Sit to stand from chair;Unsupported Upper Body Dressing: Simulated;Set up Where Assessed - Upper Body Dressing: Sit to stand from chair;Unsupported Lower Body Dressing: Simulated;Minimal assistance Where Assessed - Lower Body Dressing: Sit to stand from chair;Unsupported Toilet Transfer: Performed;Modified independent Toilet Transfer Method: Proofreader: Bedside commode Toileting - Clothing Manipulation: Performed;Independent Where Assessed - Toileting Clothing Manipulation: Standing Toileting - Hygiene: Performed;Independent Where Assessed - Toileting Hygiene: Sit on 3-in-1 or toilet Tub/Shower Transfer: Simulated;Supervision/safety Equipment Used: Gait belt Ambulation Related to ADLs: supervision ADL Comments: Husband will A as needed.    OT Goals Acute Rehab OT Goals OT Goal Formulation:  (eval  only)  Visit Information  Last OT Received On: 09/23/11 Assistance Needed: +1    Subjective Data      Prior Functioning  Home Living Lives With: Spouse Available Help at Discharge: Family;Available 24 hours/day (until monday 5/5) Type of Home: House Home Access: Ramped entrance Home Layout: One level;Able to live on main level with bedroom/bathroom Bathroom Shower/Tub: Tub/shower unit;Curtain Bathroom Toilet: Handicapped height Bathroom Accessibility: Yes How Accessible: Accessible via walker Home Adaptive Equipment: Bedside commode/3-in-1;Shower chair with back;Walker - rolling;Wheelchair - Medical illustrator Prior Function Level of Independence: Independent Able to Take Stairs?: Yes Driving: Yes Vocation: Full time employment Comments: clerical Communication Communication: No difficulties Dominant Hand: Right    Cognition  Overall Cognitive Status: Appears within functional limits for tasks assessed/performed Arousal/Alertness: Awake/alert Orientation Level: Oriented X4 / Intact Behavior During Session: WFL for tasks performed    Extremity/Trunk Assessment Right Upper Extremity Assessment RUE ROM/Strength/Tone: Within functional levels Left Upper Extremity Assessment LUE ROM/Strength/Tone: Within functional levels Trunk Assessment Trunk Assessment: Normal    Mobility  Transfers Transfers: Sit to Stand;Stand to Sit Sit to Stand: 5: Supervision;From chair/3-in-1 Stand to Sit: To chair/3-in-1;5: Supervision Details for Transfer Assistance: v/c's for hand placement and L LE mangement   Exercise   Balance Balance Balance Assessed: No  End of Session OT - End of Session Equipment Utilized During Treatment: Gait belt Activity Tolerance: Patient tolerated treatment well Patient left: in chair;with call bell/phone within reach;with family/visitor present CPM Left Knee CPM Left Knee: Off   Biviana Saddler,HILLARY 09/23/2011, 9:30 AM Luisa Dago, OTR/L  [phone] 09/23/2011

## 2011-09-23 NOTE — Progress Notes (Signed)
ANTICOAGULATION CONSULT NOTE - Initial Consult  Pharmacy Consult for Coumadin Indication: VTE prophylaxis  Allergies  Allergen Reactions  . Shellfish Allergy Swelling  . Penicillins Rash    Patient Measurements: Height: 5\' 8"  (172.7 cm) Weight: 190 lb (86.183 kg) IBW/kg (Calculated) : 63.9  Heparin Dosing Weight: 87.9 kg  Vital Signs: Temp: 98.2 F (36.8 C) (04/30 0540) BP: 124/72 mmHg (04/30 0540) Pulse Rate: 106  (04/30 0540)  Labs:  Basename 09/23/11 0615  HGB 9.6*  HCT 29.5*  PLT 234  APTT --  LABPROT 15.1  INR 1.17  HEPARINUNFRC --  CREATININE 1.00  CKTOTAL --  CKMB --  TROPONINI --   Estimated Creatinine Clearance: 75.6 ml/min (by C-G formula based on Cr of 1).  Medical History: Past Medical History  Diagnosis Date  . Gout   . Hypertension   . Hyperlipemia   . Hypothyroidism   . PONV (postoperative nausea and vomiting)   . Bronchitis 2013 April     bronchitis ... took a round of clarithromycin  and prednisone x 3 days.   . Depression   . GERD (gastroesophageal reflux disease)   . Headache   . Chronic kidney disease     no NSIADS/anitinflammatory    Medications:  Prescriptions prior to admission  Medication Sig Dispense Refill  . allopurinol (ZYLOPRIM) 300 MG tablet Take 600 mg by mouth at bedtime.      Marland Kitchen amitriptyline (ELAVIL) 100 MG tablet Take 100 mg by mouth at bedtime.      Marland Kitchen atenolol (TENORMIN) 50 MG tablet Take 50 mg by mouth every morning.      Marland Kitchen atorvastatin (LIPITOR) 20 MG tablet Take 20 mg by mouth every morning.      . clonazePAM (KLONOPIN) 0.5 MG tablet Take 0.25 mg by mouth at bedtime as needed. For sleep      . colchicine 0.6 MG tablet Take 0.6 mg by mouth at bedtime.      . DULoxetine (CYMBALTA) 30 MG capsule Take 90 mg by mouth at bedtime.      . furosemide (LASIX) 80 MG tablet Take 80 mg by mouth daily as needed. For swelling.      . levalbuterol (XOPENEX) 1.25 MG/0.5ML nebulizer solution Take 1 ampule by nebulization every 4  (four) hours as needed. For shortness of breath/wheezing      . levothyroxine (SYNTHROID, LEVOTHROID) 88 MCG tablet Take 88 mcg by mouth at bedtime.      . modafinil (PROVIGIL) 200 MG tablet Take 200-400 mg by mouth daily.      . Multiple Vitamin (MULITIVITAMIN WITH MINERALS) TABS Take 1 tablet by mouth daily.      . nitrofurantoin, macrocrystal-monohydrate, (MACROBID) 100 MG capsule Take 100 mg by mouth daily as needed. After intercourse if pt feels an onset of infection (per pt) and/or once daily for a course of 7days      . pantoprazole (PROTONIX) 40 MG tablet Take 40 mg by mouth 2 (two) times daily.      . potassium chloride (K-DUR) 10 MEQ tablet Take 20 mEq by mouth every evening.       . rizatriptan (MAXALT) 5 MG tablet Take 5-10 mg by mouth 2 (two) times daily as needed. For migraine headaches      . VITAMIN B1-B12 IJ Inject 1,000 mcg as directed every 30 (thirty) days. On or around the 16 th of each month        Assessment: L TKA (s/p R TKA just 08/11/11). Baseline INR  1.06. CBC WNL. Coumadin points = 5 Coumadin education re-visited with patient and husband who seem very knowledgeable from last month's Coumadin dosing. INR 1.17 today. Post-op anemia noted.  ID: WBC elevated at 13.5 down to 10.9 today. Currently afebrile.   Cards: HTN, HLD: Atenolol, Lipitor. VSS  Nephr: Frequent UTI's, CRI with current Scr down to 1.  Endo: Gout, hypothyroid:  Meds: allopurinol, colchicine, levothyroxine  GI/Nutrition: GERD on PPI BID  Lytes: K only 3 on previous Bmet now on KDur 20/hs. K replaced to 4.1 today.  Neuro: Migraines: Elavil, Cymbalta, Provigil, Maxalt  Pulm: OSA  Goal of Therapy:  INR 2-3   Plan:  Coumadin 7.5mg  po x 1 again today.   Merilynn Finland, [employer] 09/23/2011,10:06 AM

## 2011-09-23 NOTE — Progress Notes (Signed)
Physical Therapy Evaluation Patient Details Name: Valerie Moore MRN: [id number] DOB: [birth year] Today's Date: 09/23/2011 Time: 7829-5621 PT Time Calculation (min): 27 min  PT Assessment / Plan / Recommendation Clinical Impression  Pt s/p L TKA presenting with increased L knee pain, decreased L knee ROM and decreased L LE strenght. Patient familar with rehab process due to pt having R TKA completed 6 weeks ago. Patient demonstrates great potential to be able to return home with spouse when approved by MD.    PT Assessment  Patient needs continued PT services    Follow Up Recommendations  Home health PT;Supervision - Intermittent    Equipment Recommendations  None recommended by PT (has DME from previous surgery)    Frequency 7X/week    Precautions / Restrictions Precautions Precautions: Knee Restrictions Weight Bearing Restrictions: Yes LLE Weight Bearing: Weight bearing as tolerated   Pertinent Vitals/Pain 2/10 L knee upon arrival      Mobility  Bed Mobility Bed Mobility: Supine to Sit Supine to Sit: 4: Min assist;HOB elevated (hob at 37 deg) Details for Bed Mobility Assistance: assist for L LE mangement Transfers Transfers: Sit to Stand;Stand to Sit Sit to Stand: 4: Min assist;With upper extremity assist;From bed (up to RW) Stand to Sit: 4: Min assist;With armrests;To chair/3-in-1 Details for Transfer Assistance: v/c's for hand placement and L LE mangement Ambulation/Gait Ambulation/Gait Assistance: 4: Min assist Ambulation Distance (Feet): 10 Feet to bathroom Assistive device: Rolling walker Ambulation/Gait Assistance Details: v/c's for sequencing, no episodes of L knee buckling Gait Pattern: Step-to pattern;Decreased step length - left;Decreased stance time - left;Antalgic Gait velocity: slow Stairs: No Wheelchair Mobility Wheelchair Mobility: No    Exercises Total Joint Exercises Ankle Circles/Pumps: AROM;Both;10 reps;Supine Quad Sets: AROM;Left;10  reps;Supine Knee Flexion: AAROM;Left;10 reps;Supine (30 degrees AAROM)   PT Goals Acute Rehab PT Goals PT Goal Formulation: With patient Time For Goal Achievement: 09/30/11 Potential to Achieve Goals: Fair Pt will go Supine/Side to Sit: with modified independence;with HOB 0 degrees PT Goal: Supine/Side to Sit - Progress: Goal set today Pt will go Sit to Stand: with modified independence;with upper extremity assist (up to RW.) PT Goal: Sit to Stand - Progress: Goal set today Pt will go Stand to Sit: with modified independence;with upper extremity assist PT Goal: Stand to Sit - Progress: Goal set today Pt will Ambulate: >150 feet;with modified independence;with rolling walker PT Goal: Ambulate - Progress: Goal set today Pt will Go Up / Down Stairs: 3-5 stairs;with supervision;with rolling walker (using backwards technique.) PT Goal: Up/Down Stairs - Progress: Goal set today Pt will Perform Home Exercise Program: Independently PT Goal: Perform Home Exercise Program - Progress: Goal set today  Visit Information  Last PT Received On: 09/23/11 Assistance Needed: +1    Subjective Data  Subjective: Pt received supine in bed with spouse present with L knee pain at 2/10.   Prior Functioning  Home Living Lives With: Spouse Available Help at Discharge: Family;Available 24 hours/day (until monday 5/5) Type of Home: House Home Access: Ramped entrance Home Layout: One level;Able to live on main level with bedroom/bathroom Bathroom Shower/Tub: Tub/shower unit;Curtain Bathroom Toilet: Handicapped height Bathroom Accessibility: Yes How Accessible: Accessible via walker Home Adaptive Equipment: Bedside commode/3-in-1;Shower chair with back;Walker - rolling;Wheelchair - Medical illustrator Prior Function Level of Independence: Independent Able to Take Stairs?: Yes Driving: Yes Vocation: Full time employment Comments: clerical Communication Communication: No difficulties Dominant Hand: Right     Cognition  Overall Cognitive Status: Appears within functional limits for tasks assessed/performed Arousal/Alertness:  Awake/alert Orientation Level: Oriented X4 / Intact Behavior During Session: [facility] for tasks performed    Extremity/Trunk Assessment Right Upper Extremity Assessment RUE ROM/Strength/Tone: Within functional levels Left Upper Extremity Assessment LUE ROM/Strength/Tone: Within functional levels Right Lower Extremity Assessment RLE ROM/Strength/Tone: Within functional levels Left Lower Extremity Assessment LLE ROM/Strength/Tone: Deficits (pt can initiate quad set and achieve approx 30 deg knee PROM) Trunk Assessment Trunk Assessment: Normal   Balance Balance Balance Assessed: No  End of Session PT - End of Session Equipment Utilized During Treatment: Gait belt Activity Tolerance: Patient tolerated treatment well Patient left: with family/visitor present (in bathroom with OT) Nurse Communication: Mobility status CPM Left Knee CPM Left Knee: Off   Valerie Moore, Valerie Moore 09/23/2011, 9:00 AM  Lewis Shock, PT, DPT Pager #: [phone] Office #: [phone]

## 2011-09-23 NOTE — Progress Notes (Signed)
Patient ID: PORCHE STEINBERGER, female   DOB: 09-May-1961, 51 y.o.   MRN: [id number] PATIENT ID: NATAYAH WARMACK  MRN: [id number]  DOB/AGE:  07-01-1960 / 51 y.o.  1 Day Post-Op Procedure(s) (LRB): TOTAL KNEE ARTHROPLASTY (Left)    PROGRESS NOTE Subjective: Patient is alert, oriented, no Nausea, no Vomiting, yes} passing gas, no Bowel Movement. Taking PO sips. Denies SOB, Chest or Calf Pain. Using Incentive Spirometer, PAS in place. Ambulate WBAT, CPM 0-30 Patient reports pain as 10 on 0-10 scale, had similar problems with pain control after her other total knee in March of 2013. Reassured her that she is on the strongest medicines we can safely give her.    Objective: Vital signs in last 24 hours: Filed Vitals:   09/22/11 1315 09/22/11 2116 09/23/11 0153 09/23/11 0540  BP: 101/64 98/67 101/67 124/72  Pulse: 95 94 100 106  Temp: 97.7 F (36.5 C) 98.1 F (36.7 C) 98.4 F (36.9 C) 98.2 F (36.8 C)  TempSrc:      Resp: 16 18 20 20   Height:   5\' 8"  (1.727 m)   Weight:   86.183 kg (190 lb)   SpO2: 94% 98% 98% 98%      Intake/Output from previous day: I/O last 3 completed shifts: In: 1700 [I.V.:1700] Out: 1250 [Urine:600; Drains:650]   Intake/Output this shift: Total I/O In: 1615 [P.O.:240; I.V.:1375] Out: 200 [Drains:200]   LABORATORY DATA: No results found for this basename: WBC:2,HGB:2,HCT:2,PLT:2,NA:2,K:2,CL:2,CO2:2,BUN:2,CREATININE:2,GLUCOSE:2,GLUCAP:3,PT:2,INR:2,CALCIUM,2 in the last 72 hours  Examination: Neurologically intact ABD soft Neurovascular intact Sensation intact distally Intact pulses distally Dorsiflexion/Plantar flexion intact Incision: no drainage No cellulitis present Compartment soft} Blood and plasma separated in drain indicating minimal recent drainage, drain pulled without difficulty.  Assessment:   1 Day Post-Op Procedure(s) (LRB): TOTAL KNEE ARTHROPLASTY (Left) ADDITIONAL DIAGNOSIS:    Plan: PT/OT WBAT, CPM 5/hrs day until ROM 0-90 degrees, then D/C  CPM DVT Prophylaxis:  SCDx72hr\Coumadin for 2 weeks target INR 1.5-2.0 DISCHARGE PLAN: Home DISCHARGE NEEDS: HHPT, HHRN, CPM, Walker and 3-in-1 comode seat     Jaquelin Meaney J 09/23/2011, 7:00 AM

## 2011-09-23 NOTE — Progress Notes (Signed)
PT Progress Note     09/23/11 1400  PT Visit Information  Last PT Received On 09/23/11  Assistance Needed +1  PT Time Calculation  PT Start Time 1311  PT Stop Time 1349  PT Time Calculation (min) 38 min  Precautions  Precautions Knee  Restrictions  LLE Weight Bearing WBAT  Cognition  Overall Cognitive Status Appears within functional limits for tasks assessed/performed  Arousal/Alertness Awake/alert  Orientation Level Oriented X4 / Intact  Behavior During Session [facility] for tasks performed  Bed Mobility  Bed Mobility Supine to Sit;Sit to Supine  Supine to Sit 4: Min assist;HOB flat  Sit to Supine 4: Min assist;HOB flat  Details for Bed Mobility Assistance (A) for LLE   Transfers  Transfers Sit to Stand;Stand to Sit  Sit to Stand 5: Supervision;From bed;With upper extremity assist;From chair/3-in-1  Stand to Sit 5: Supervision;With upper extremity assist;With armrests;To bed;To chair/3-in-1  Details for Transfer Assistance Cues for LLE positioning  Ambulation/Gait  Ambulation/Gait Assistance 4: Min guard  Ambulation Distance (Feet) 160 Feet  Assistive device Rolling walker  Ambulation/Gait Assistance Details Min guard for safety.    Gait Pattern Step-to pattern;Decreased stance time - left;Decreased step length - right  Gait velocity slow  Stairs No  Wheelchair Mobility  Wheelchair Mobility No  Balance  Balance Assessed No  Exercises  Exercises Total Joint  Total Joint Exercises  Ankle Circles/Pumps AROM;Both;10 reps;Supine  Quad Sets AROM;Left;10 reps;Supine  Straight Leg Raises AAROM;Strengthening;Left;10 reps;Supine  PT - End of Session  Equipment Utilized During Treatment Gait belt  Activity Tolerance Patient tolerated treatment well  Patient left in bed;in CPM;with family/visitor present  PT - Assessment/Plan  Comments on Treatment Session Pt making great progress with mobility.  Increased ambulation distance.  Very motivated.  Pt in CPM before/After PT session  (0-55 degrees)  PT Plan Discharge plan remains appropriate  PT Frequency 7X/week  Follow Up Recommendations Home health PT;Supervision - Intermittent  Equipment Recommended None recommended by PT  Acute Rehab PT Goals  PT Goal: Supine/Side to Sit - Progress Not met  PT Goal: Sit to Stand - Progress Not met  PT Goal: Stand to Sit - Progress Not met  PT Goal: Ambulate - Progress Progressing toward goal  PT Goal: Perform Home Exercise Program - Progress Progressing toward goal    Pain level:  4/10 Lt knee   Valerie Moore, [place] [phone] 09/23/2011

## 2011-09-24 LAB — CBC
HCT: 26.4 % — ABNORMAL LOW (ref 36.0–46.0)
MCH: 30.7 pg (ref 26.0–34.0)
MCHC: 33 g/dL (ref 30.0–36.0)
MCV: 93.3 fL (ref 78.0–100.0)
RDW: 14 % (ref 11.5–15.5)

## 2011-09-24 MED ORDER — WARFARIN SODIUM 5 MG PO TABS: 5.0000 mg | ORAL_TABLET | Freq: Every day | ORAL | Status: DC

## 2011-09-24 MED ORDER — OXYCODONE-ACETAMINOPHEN 5-325 MG PO TABS: 1.0000 | ORAL_TABLET | ORAL | Status: AC | PRN

## 2011-09-24 MED ORDER — METHOCARBAMOL 500 MG PO TABS: 500.0000 mg | ORAL_TABLET | Freq: Four times a day (QID) | ORAL | Status: AC | PRN

## 2011-09-24 NOTE — Progress Notes (Signed)
PATIENT ID: Valerie Moore  MRN: [id number]  DOB/AGE:  1961/04/19 / 51 y.o.  2 Days Post-Op Procedure(s) (LRB): TOTAL KNEE ARTHROPLASTY (Left)    PROGRESS NOTE Subjective: Patient is alert, oriented, no Nausea, no Vomiting, yes} passing gas, no Bowel Movement. Taking PO well. Denies SOB, Chest or Calf Pain. Using Incentive Spirometer, PAS in place. Ambulating in the hallway with PT. Patient reports pain as moderate  .    Objective: Vital signs in last 24 hours: Filed Vitals:   09/23/11 1800 09/23/11 2146 09/24/11 0234 09/24/11 0600  BP: 89/62 101/65 109/63 104/77  Pulse: 84 93 98 104  Temp:  100.4 F (38 C)  98.8 F (37.1 C)  TempSrc:      Resp: 16 18 18 19   Height:      Weight:      SpO2: 97% 100%  97%      Intake/Output from previous day: I/O last 3 completed shifts: In: 2315 [P.O.:440; I.V.:1375; IV Piggyback:500] Out: 800 [Urine:600; Drains:200]   Intake/Output this shift:     LABORATORY DATA:  Basename 09/24/11 0558 09/23/11 0615  WBC 9.1 10.9*  HGB 8.7* 9.6*  HCT 26.4* 29.5*  PLT 212 234  NA -- 135  K -- 4.1  CL -- 97  CO2 -- 27  BUN -- 7  CREATININE -- 1.00  GLUCOSE -- 134*  GLUCAP -- --  INR 1.67* 1.17  CALCIUM -- 9.2    Examination: Neurologically intact ABD soft Neurovascular intact Sensation intact distally Intact pulses distally Dorsiflexion/Plantar flexion intact Incision: dressing C/D/I}  Assessment:   2 Days Post-Op Procedure(s) (LRB): TOTAL KNEE ARTHROPLASTY (Left) ADDITIONAL DIAGNOSIS:  Acute Blood Loss Anemia - asymptomatic  Plan: PT/OT WBAT, CPM 5/hrs day until ROM 0-90 degrees, then D/C CPM DVT Prophylaxis:  SCDx72hr\Coumadin for 2 weeks target INR 1.5-2.0 DISCHARGE PLAN: Home today DISCHARGE NEEDS: HHPT, HHRN, CPM, Walker and 3-in-1 comode seat     Valerie Moore M. 09/24/2011, 8:00 AM

## 2011-09-24 NOTE — Progress Notes (Signed)
PT Progress Note:    09/24/11 1100  PT Visit Information  Last PT Received On 09/24/11  Assistance Needed +1  PT Time Calculation  PT Start Time 0735  PT Stop Time 0800  PT Time Calculation (min) 25 min  Subjective Data  Subjective "I hurts more than yesterday"  Precautions  Precautions Knee  Restrictions  Weight Bearing Restrictions Yes  LLE Weight Bearing WBAT  Cognition  Overall Cognitive Status Appears within functional limits for tasks assessed/performed  Arousal/Alertness Awake/alert  Orientation Level Oriented X4 / Intact  Behavior During Session [facility] for tasks performed  Bed Mobility  Bed Mobility Supine to Sit  Supine to Sit 4: Min assist;HOB flat  Details for Bed Mobility Assistance (A) for LLE   Transfers  Transfers Sit to Stand;Stand to Sit  Sit to Stand With upper extremity assist;From bed;From chair/3-in-1;5: Supervision  Stand to Sit 5: Supervision;To chair/3-in-1;With armrests;With upper extremity assist  Details for Transfer Assistance Supervision for safety due to increased pain today.  No physical (A) needed but transition with sit>stand not quite as smooth as previous session  Ambulation/Gait  Ambulation/Gait Assistance 5: Supervision  Ambulation Distance (Feet) 140 Feet  Assistive device Rolling walker  Ambulation/Gait Assistance Details Supervision for safety due to pain.  Encouragement to attempt step-through gait pattern & cues for use of UE's to off-load weight on LLE due to pain  Gait Pattern Step-to pattern  Stairs Yes  Stairs Assistance 4: Min assist  Stairs Assistance Details (indicate cue type and reason) Pt able to recall stair negotiation technique from previous knee surgery.  (A) for RW management  Stair Management Technique No rails;Backwards;With walker  Number of Stairs 2   Wheelchair Mobility  Wheelchair Mobility No  Balance  Balance Assessed No  PT - End of Session  Equipment Utilized During Treatment Gait belt  Activity Tolerance  Patient tolerated treatment well;Patient limited by pain  Patient left in chair;with call bell/phone within reach  Nurse Communication Mobility status  PT - Assessment/Plan  Comments on Treatment Session Continues to progress well with PT goals.  Pt reports pain as being more intense today however pt still able to ambulate & perform stairs.    PT Plan Discharge plan remains appropriate  PT Frequency 7X/week  Follow Up Recommendations Home health PT;Supervision - Intermittent  Equipment Recommended None recommended by PT  Acute Rehab PT Goals  PT Goal: Supine/Side to Sit - Progress Not met  PT Goal: Sit to Stand - Progress Progressing toward goal  PT Goal: Stand to Sit - Progress Progressing toward goal  PT Goal: Ambulate - Progress Progressing toward goal  PT Goal: Up/Down Stairs - Progress Progressing toward goal      Verdell Face, PTA [phone] 09/24/2011

## 2011-09-24 NOTE — Progress Notes (Signed)
CARE MANAGEMENT NOTE 09/24/2011  Patient:  Valerie Moore, Valerie Moore   Account Number:  [id number]  Date Initiated:  09/24/2011  Documentation initiated by:  Vance Peper  Subjective/Objective Assessment:   51 yr old female s/p left total knee arthroplasty     Action/Plan:   Spoke with patient regarding home health needs. Choice offered. Has rolling walker and 3in1, CPM to be delivered by [employer] this afternoon.   Anticipated DC Date:  09/24/2011   Anticipated DC Plan:  HOME W HOME HEALTH SERVICES      DC Planning Services  CM consult      [facility] Choice  HOME HEALTH   Choice offered to / List presented to:  C-1 Patient        HH arranged  HH-1 RN  HH-2 PT      [facility] agency  [facility]   Status of service:  Completed, signed off  Discharge Disposition:  HOME W HOME HEALTH SERVICES

## 2011-09-24 NOTE — Discharge Summary (Signed)
Patient ID: Valerie Moore MRN: [id number] DOB/AGE: May 09, 1961 51 y.o.  Admit date: 09/22/2011 Discharge date: 09/24/2011  Admission Diagnoses:  Principal Problem:  *Arthritis of left knee   Discharge Diagnoses:  Same  Past Medical History  Diagnosis Date  . Gout   . Hypertension   . Hyperlipemia   . Hypothyroidism   . PONV (postoperative nausea and vomiting)   . Bronchitis 10-19-2011    bronchitis ... took a round of clarithromycin  and prednisone x 3 days.   . Depression   . GERD (gastroesophageal reflux disease)   . Headache   . Chronic kidney disease     no NSIADS/anitinflammatory    Surgeries: Procedure(s): TOTAL KNEE ARTHROPLASTY on 09/22/2011   Consultants:    Discharged Condition: Improved  Hospital Course: Valerie Moore is an 51 y.o. female who was admitted 09/22/2011 for operative treatment ofArthritis of left knee. Patient has severe unremitting pain that affects sleep, daily activities, and work/hobbies. After pre-op clearance the patient was taken to the operating room on 09/22/2011 and underwent  Procedure(s): TOTAL KNEE ARTHROPLASTY.    Patient was given perioperative antibiotics: Anti-infectives     Start     Dose/Rate Route Frequency Ordered Stop   09/21/11 1101   vancomycin (VANCOCIN) 1,500 mg in sodium chloride 0.9 % 500 mL IVPB        1,500 mg 250 mL/hr over 120 Minutes Intravenous 120 min pre-op 09/21/11 1101 09/22/11 0950           Patient was given sequential compression devices, early ambulation, and chemoprophylaxis to prevent DVT.  Patient benefited maximally from hospital stay and there were no complications.    Recent vital signs: Patient Vitals for the past 24 hrs:  BP Temp Pulse Resp SpO2  09/24/11 0600 104/77 mmHg 98.8 F (37.1 C) 104  19  97 %  09/24/11 0234 109/63 mmHg - 98  18  -  19-Oct-2011 2146 101/65 mmHg 100.4 F (38 C) 93  18  100 %  Oct 19, 2011 1800 89/62 mmHg - 84  16  97 %  10/19/2011 1644 80/53 mmHg - 90  18  94 %  10-19-2011  1606 88/64 mmHg - 80  16  98 %  19-Oct-2011 1502 90/62 mmHg - 86  16  98 %  19-Oct-2011 1400 102/66 mmHg 98.9 F (37.2 C) 83  16  92 %     Recent laboratory studies:  Basename 09/24/11 0558 10-19-11 0615  WBC 9.1 10.9*  HGB 8.7* 9.6*  HCT 26.4* 29.5*  PLT 212 234  NA -- 135  K -- 4.1  CL -- 97  CO2 -- 27  BUN -- 7  CREATININE -- 1.00  GLUCOSE -- 134*  INR 1.67* 1.17  CALCIUM -- 9.2     Discharge Medications:   Medication List  As of 09/24/2011  8:06 AM   TAKE these medications         allopurinol 300 MG tablet   Commonly known as: ZYLOPRIM   Take 600 mg by mouth at bedtime.      amitriptyline 100 MG tablet   Commonly known as: ELAVIL   Take 100 mg by mouth at bedtime.      atenolol 50 MG tablet   Commonly known as: TENORMIN   Take 50 mg by mouth every morning.      atorvastatin 20 MG tablet   Commonly known as: LIPITOR   Take 20 mg by mouth every morning.  clonazePAM 0.5 MG tablet   Commonly known as: KLONOPIN   Take 0.25 mg by mouth at bedtime as needed. For sleep      colchicine 0.6 MG tablet   Take 0.6 mg by mouth at bedtime.      DULoxetine 30 MG capsule   Commonly known as: CYMBALTA   Take 90 mg by mouth at bedtime.      furosemide 80 MG tablet   Commonly known as: LASIX   Take 80 mg by mouth daily as needed. For swelling.      levalbuterol 1.25 MG/0.5ML nebulizer solution   Commonly known as: XOPENEX   Take 1 ampule by nebulization every 4 (four) hours as needed. For shortness of breath/wheezing      levothyroxine 88 MCG tablet   Commonly known as: SYNTHROID, LEVOTHROID   Take 88 mcg by mouth at bedtime.      methocarbamol 500 MG tablet   Commonly known as: ROBAXIN   Take 1 tablet (500 mg total) by mouth every 6 (six) hours as needed.      modafinil 200 MG tablet   Commonly known as: PROVIGIL   Take 200-400 mg by mouth daily.      mulitivitamin with minerals Tabs   Take 1 tablet by mouth daily.      nitrofurantoin  (macrocrystal-monohydrate) 100 MG capsule   Commonly known as: MACROBID   Take 100 mg by mouth daily as needed. After intercourse if pt feels an onset of infection (per pt) and/or once daily for a course of 7days      oxyCODONE-acetaminophen 5-325 MG per tablet   Commonly known as: PERCOCET   Take 1-2 tablets by mouth every 4 (four) hours as needed.      pantoprazole 40 MG tablet   Commonly known as: PROTONIX   Take 40 mg by mouth 2 (two) times daily.      potassium chloride 10 MEQ tablet   Commonly known as: K-DUR   Take 20 mEq by mouth every evening.      rizatriptan 5 MG tablet   Commonly known as: MAXALT   Take 5-10 mg by mouth 2 (two) times daily as needed. For migraine headaches      VITAMIN B1-B12 IJ   Inject 1,000 mcg as directed every 30 (thirty) days. On or around the 16 th of each month      warfarin 5 MG tablet   Commonly known as: COUMADIN   Take 1 tablet (5 mg total) by mouth daily. Take as directed by home health.  Target INR 1.5-2.0            Diagnostic Studies: No results found.  Disposition: 06-Home-Health Care Svc  Discharge Orders    Future Orders Please Complete By Expires   Increase activity slowly      Walker       May shower / Bathe      Driving Restrictions      Comments:   No driving for 2 weeks.   Change dressing (specify)      Comments:   Dressing change as needed.   Call MD for:  temperature >100.4      Call MD for:  severe uncontrolled pain      Call MD for:  redness, tenderness, or signs of infection (pain, swelling, redness, odor or green/yellow discharge around incision site)      Discharge instructions      Comments:   F/U with Dr. Turner Daniels in 10-12 days.  SignedHazle Nordmann. 09/24/2011, 8:06 AM

## 2011-09-24 NOTE — Discharge Instructions (Signed)
Home Health to be provided by [facility] [phone] 

## 2012-01-15 ENCOUNTER — Other Ambulatory Visit: Payer: Self-pay

## 2012-01-15 DIAGNOSIS — R609 Edema, unspecified: Secondary | ICD-10-CM

## 2012-02-20 ENCOUNTER — Encounter: Payer: Self-pay | Admitting: Surgery

## 2012-02-23 ENCOUNTER — Encounter: Payer: Self-pay | Admitting: Surgery

## 2012-02-23 ENCOUNTER — Encounter (INDEPENDENT_AMBULATORY_CARE_PROVIDER_SITE_OTHER): Payer: BC Managed Care – PPO | Admitting: *Deleted

## 2012-02-23 ENCOUNTER — Ambulatory Visit (INDEPENDENT_AMBULATORY_CARE_PROVIDER_SITE_OTHER): Payer: BC Managed Care – PPO | Admitting: Surgery

## 2012-02-23 VITALS — BP 112/76 | HR 66 | Resp 16 | Ht 68.0 in | Wt 205.0 lb

## 2012-02-23 DIAGNOSIS — R609 Edema, unspecified: Secondary | ICD-10-CM

## 2012-02-23 NOTE — Progress Notes (Signed)
[facility] [place]   Patient name: Valerie Moore MRN: [id number] DOB: 07/30/1960 Sex: female   Referred by: Dr Sudie Bailey  Reason for referral:  Chief Complaint  Patient presents with  . New Evaluation    chronic fluctuating bilateral lower leg edema/Dr. Sudie Bailey    HISTORY OF PRESENT ILLNESS: The patient comes in today for evaluation of swelling in both lower extremities as well as numbness in her right leg. She states that the swelling has been present for several years. There is no associated trauma with the onset of her swelling. Her numbness began 6-8 weeks ago. She describes it as a decreased sensation on the inside of her right leg. There are no seeding or stabbing pains. The patient states that there are no relieving factors to her swelling. She tries to elevate them that this produces little improvement. Her swelling is worse when her legs are downgoing she is on her feet. She does not have ulcers. She denies a history of trauma. She denies a history of deep vein thrombosis.  The patient is medically managed for hypertension and hyperlipidemia. Both of which are under good control currently  Past Medical History  Diagnosis Date  . Gout   . Hypertension   . Hyperlipemia   . Hypothyroidism   . PONV (postoperative nausea and vomiting)   . Bronchitis 2013 April     bronchitis ... took a round of clarithromycin  and prednisone x 3 days.   . Depression   . GERD (gastroesophageal reflux disease)   . Headache   . Chronic kidney disease     no NSIADS/anitinflammatory  . Anemia     Past Surgical History  Procedure Date  . Quadriceps repair     lft  . Cesarean section   . Carpal tunnel release     rt  . Cholecystectomy   . Rotator cuff repair     bilateral  . Total knee arthroplasty 08/11/2011    Procedure: TOTAL KNEE ARTHROPLASTY;  Surgeon: Nestor Lewandowsky, MD;  Location: [facility] OR;  Service: Orthopedics;  Laterality: Right;  DEPUY/ SIGMA   . Thumb  arthroscopy 2011    repaired joint .." basal thumb joint"  . Total knee arthroplasty 09/22/2011    Procedure: TOTAL KNEE ARTHROPLASTY;  Surgeon: Nestor Lewandowsky, MD;  Location: [facility] OR;  Service: Orthopedics;  Laterality: Left;  DEPUY/SIGMA RP    History   Social History  . Marital Status: Married    Spouse Name: N/A    Number of Children: N/A  . Years of Education: N/A   Occupational History  . Not on file.   Social History Main Topics  . Smoking status: Never Smoker   . Smokeless tobacco: Never Used  . Alcohol Use: No  . Drug Use: No  . Sexually Active: Yes   Other Topics Concern  . Not on file   Social History Narrative  . No narrative on file    Family History  Problem Relation Age of Onset  . Anesthesia problems Neg Hx   . Cancer Mother     bladder  . Hyperlipidemia Mother   . Hypertension Mother   . Hyperlipidemia Brother   . Hypertension Brother     Allergies as of 02/23/2012 - Review Complete 02/23/2012  Allergen Reaction Noted  . Shellfish allergy Swelling 07/28/2011  . Penicillins Rash 07/28/2011    Current Outpatient Prescriptions on File Prior to Visit  Medication Sig Dispense Refill  . allopurinol (ZYLOPRIM)  300 MG tablet Take 600 mg by mouth at bedtime.      Marland Kitchen amitriptyline (ELAVIL) 100 MG tablet Take 100 mg by mouth at bedtime.      Marland Kitchen atenolol (TENORMIN) 50 MG tablet Take 50 mg by mouth every morning.      Marland Kitchen atorvastatin (LIPITOR) 20 MG tablet Take 20 mg by mouth every morning.      . clonazePAM (KLONOPIN) 0.5 MG tablet Take 0.25 mg by mouth at bedtime as needed. For sleep      . DULoxetine (CYMBALTA) 30 MG capsule Take 90 mg by mouth at bedtime.      . furosemide (LASIX) 80 MG tablet Take 80 mg by mouth daily as needed. For swelling.      Marland Kitchen KLOR-CON M10 10 MEQ tablet Take by mouth daily.      . Multiple Vitamin (MULITIVITAMIN WITH MINERALS) TABS Take 1 tablet by mouth daily.      . nitrofurantoin, macrocrystal-monohydrate, (MACROBID) 100 MG  capsule Take 100 mg by mouth daily as needed. After intercourse if pt feels an onset of infection (per pt) and/or once daily for a course of 7days      . pantoprazole (PROTONIX) 40 MG tablet Take 40 mg by mouth 2 (two) times daily.      Marland Kitchen VITAMIN B1-B12 IJ Inject 1,000 mcg as directed every 30 (thirty) days. On or around the 16 th of each month      . colchicine 0.6 MG tablet Take 0.6 mg by mouth at bedtime.      . levalbuterol (XOPENEX) 1.25 MG/0.5ML nebulizer solution Take 1 ampule by nebulization every 4 (four) hours as needed. For shortness of breath/wheezing      . levothyroxine (SYNTHROID, LEVOTHROID) 88 MCG tablet Take 88 mcg by mouth at bedtime.      . modafinil (PROVIGIL) 200 MG tablet Take 200-400 mg by mouth daily.      . potassium chloride (K-DUR) 10 MEQ tablet Take 20 mEq by mouth every evening.       . rizatriptan (MAXALT) 5 MG tablet Take 5-10 mg by mouth 2 (two) times daily as needed. For migraine headaches      . warfarin (COUMADIN) 5 MG tablet Take 1 tablet (5 mg total) by mouth daily. Take as directed by home health.  Target INR 1.5-2.0  30 tablet  0     REVIEW OF SYSTEMS: Cardiovascular: No chest pain, chest pressure, palpitations, orthopnea, or dyspnea on exertion. No claudication or rest pain,  No history of DVT or phlebitis. Positive for swelling in her legs Pulmonary: No productive cough, asthma or wheezing. Neurologic: No weakness, paresthesias, aphasia, or amaurosis. Positive for dizziness. Hematologic: No bleeding problems or clotting disorders. Musculoskeletal: No joint pain or joint swelling. Gastrointestinal: No blood in stool or hematemesis Genitourinary: No dysuria or hematuria. Psychiatric:: No history of major depression. Integumentary: No rashes or ulcers. Constitutional: No fever or chills.  PHYSICAL EXAMINATION: General: The patient appears their stated age.  Vital signs are BP 112/76  Pulse 66  Resp 16  Ht 5\' 8"  (1.727 m)  Wt 205 lb (92.987 kg)   BMI 31.17 kg/m2  SpO2 100% HEENT:  No gross abnormalities Pulmonary: Respirations are non-labored Abdomen: Soft and non-tender . No masses are palpated. Musculoskeletal: There are no major deformities.   Neurologic: No focal weakness or paresthesias are detected, Skin: There are no ulcer or rashes noted. Psychiatric: The patient has normal affect. Cardiovascular: There is a regular rate and rhythm without  significant murmur appreciated. Palpable right posterior tibial pulse. I cannot palpate a left posterior pulse however she does have symmetric biphasic Doppler signals in both posterior tibial arteries. There no varicosities.  Diagnostic Studies: Venous reflux examination was performed today which shows no evidence of deep or superficial vein thrombosis. There is no significant reflux identified. There is a focal incontinence within the left saphenofemoral junction with no reflux and distal branches    Assessment:  Bilateral leg swelling and right leg numbness Plan: As per the patient that I do not believe that her symptoms are related to arterial or venous insufficiency. She most likely has chronic lymphedema. I have recommended that she wear 20-30 mm compression garments. I warned  Her about the long-term risks for chronic swelling if she does not wear stockings. She will follow up with me on a when necessary basis.     Jorge Ny, M.D. [facility] [place] Office: [phone] Pager:  [phone]

## 2013-04-07 IMAGING — CR DG CHEST 2V
2 series · 2 of 2 positions shown · non-contrast
Comparison: None

CLINICAL DATA: Preop radiograph.  Right knee osteoarthritis.

CHEST - 2 VIEW

[view not recorded (1 of 2)]
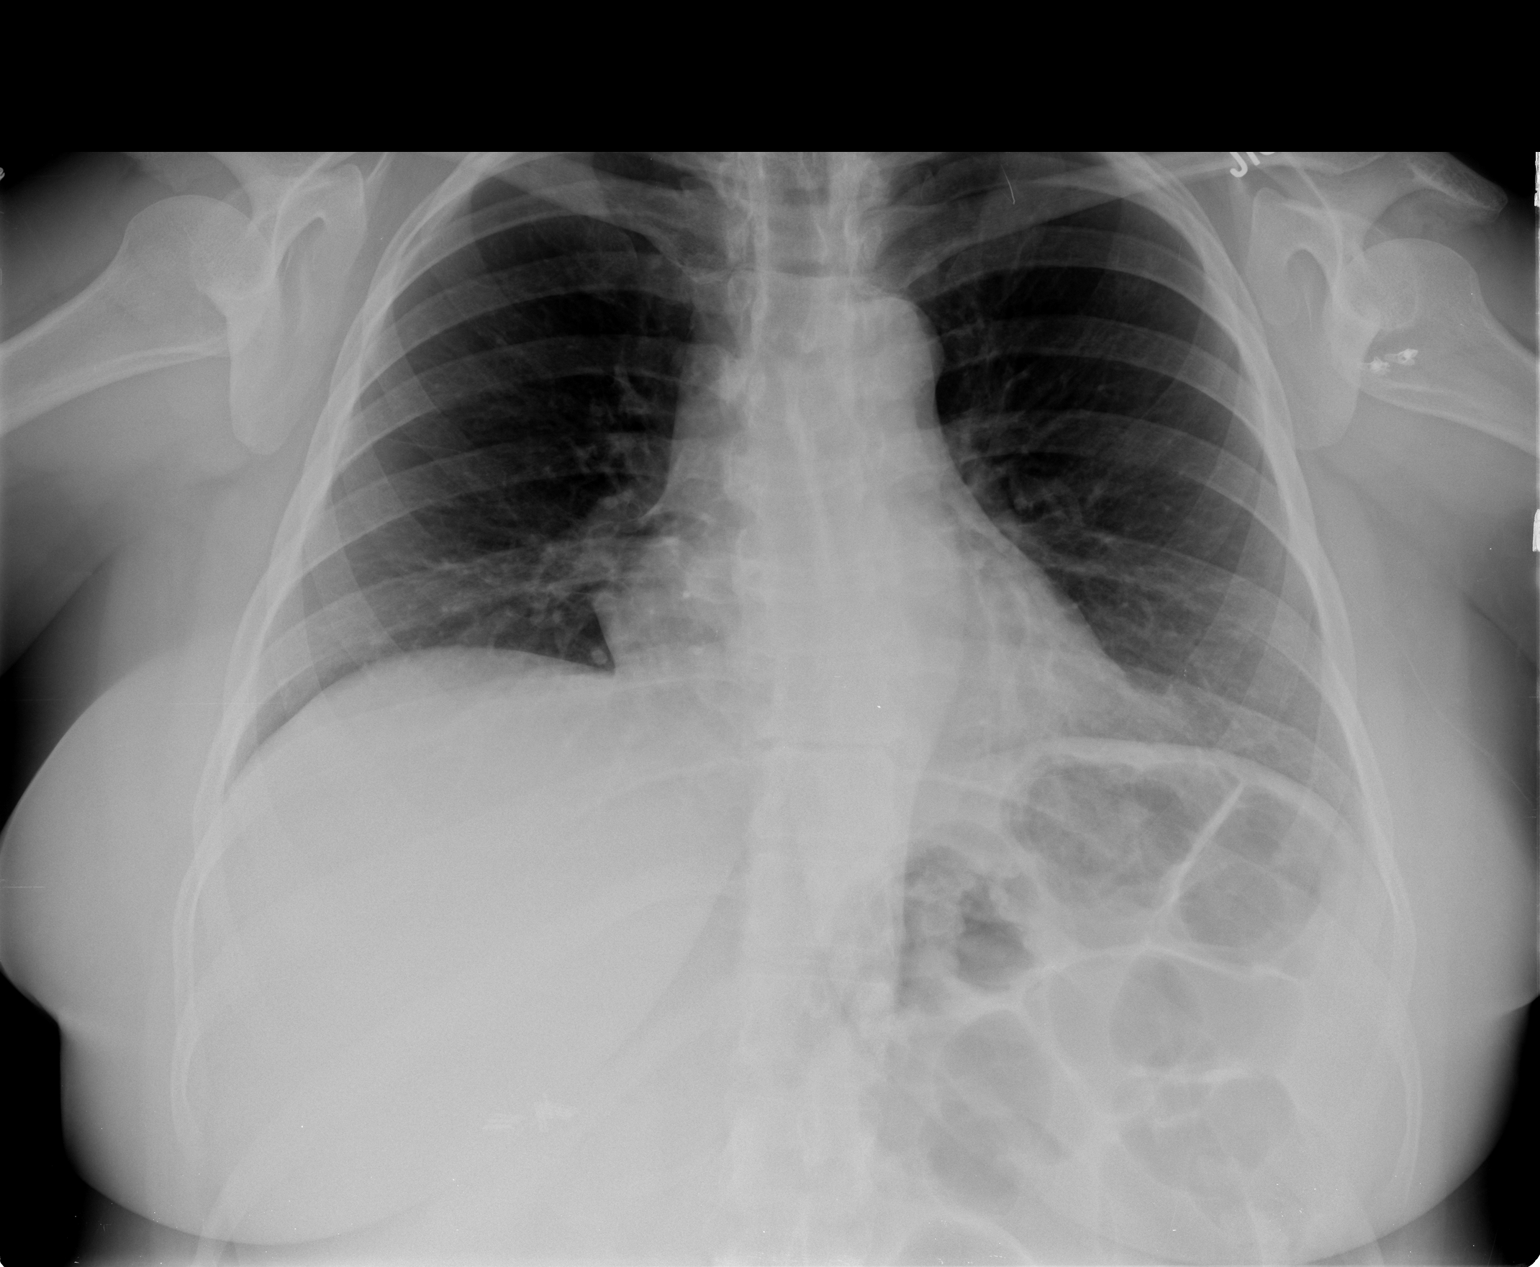

[view not recorded (2 of 2)]
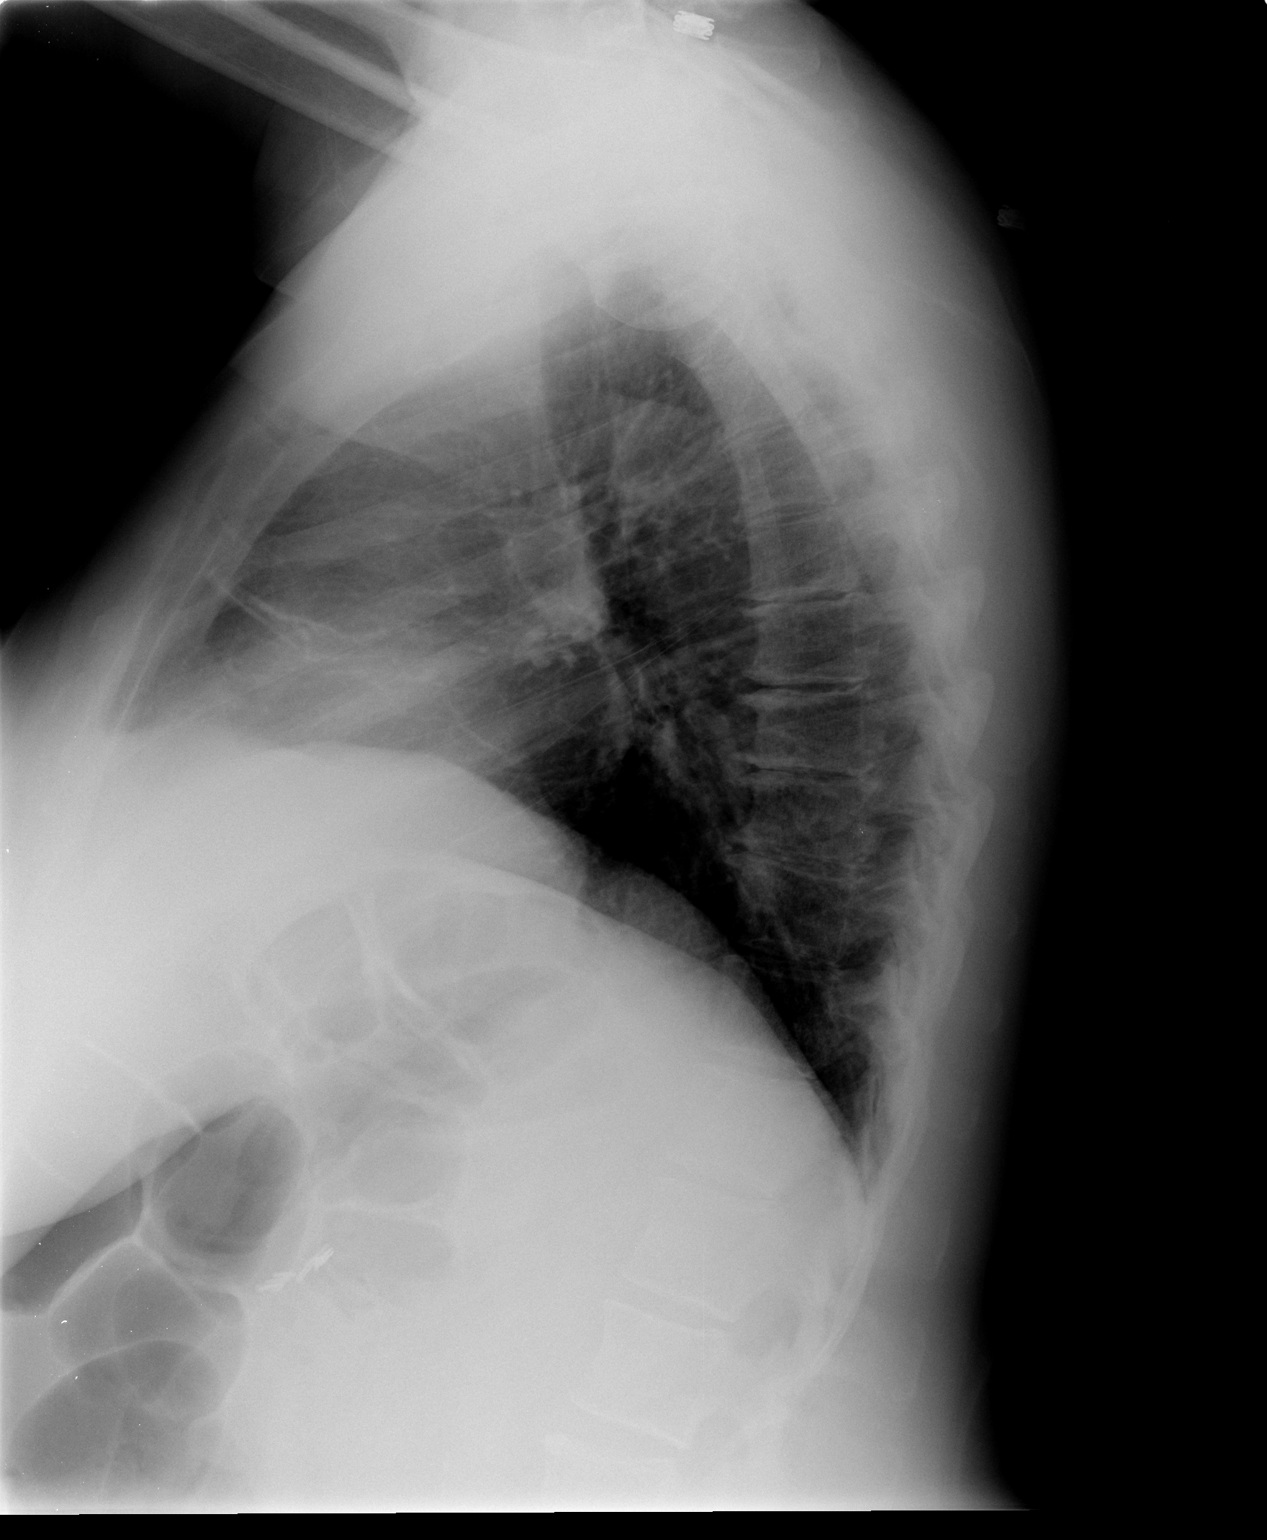

[2 of 2 positions shown; findings below may reference images not displayed]

FINDINGS: Heart size is normal.

No pleural effusion or pulmonary edema.

No airspace consolidation identified.

There is a mild multilevel spondylosis within the thoracic spine.
IMPRESSION: 1.  No active cardiopulmonary abnormalities.
2.  Thoracic spondylosis noted.

## 2013-12-28 ENCOUNTER — Ambulatory Visit (INDEPENDENT_AMBULATORY_CARE_PROVIDER_SITE_OTHER): Payer: BC Managed Care – PPO

## 2013-12-28 VITALS — BP 126/84 | HR 95 | Resp 18

## 2013-12-28 DIAGNOSIS — L600 Ingrowing nail: Secondary | ICD-10-CM

## 2013-12-28 DIAGNOSIS — M79604 Pain in right leg: Secondary | ICD-10-CM

## 2013-12-28 DIAGNOSIS — L03039 Cellulitis of unspecified toe: Secondary | ICD-10-CM

## 2013-12-28 DIAGNOSIS — M79609 Pain in unspecified limb: Secondary | ICD-10-CM

## 2013-12-28 MED ORDER — CLINDAMYCIN HCL 150 MG PO CAPS: 150.0000 mg | ORAL_CAPSULE | Freq: Three times a day (TID) | ORAL | Status: DC

## 2013-12-28 NOTE — Progress Notes (Signed)
   Subjective:    Patient ID: Valerie Moore, female    DOB: 02/28/1961, 53 y.o.   MRN: [id number]  HPI I HAVE A INGROWN TOENAIL ON MY RIGHT BIG TOE AND HAS BEEN GOING ON FOR ABOUT 3 WEEKS AND BURNS AND THROBS AND IS SORE AND TENDER AT TIMES AND I HAVE BEEN SOAKING IN PEROXIDE AND THERE HAS BEEN NO DRAINING    Review of Systems  HENT: Positive for sinus pressure and sore throat.   Endocrine: Positive for heat intolerance.  Musculoskeletal:       JOINT PAIN  Skin:       CHANGE IN NAILS  Allergic/Immunologic: Positive for food allergies.       SHELLFISH  All other systems reviewed and are negative.      Objective:   Physical Exam 53 year old white female well-developed well-nourished oriented x3 presents at this time with a complaint of or recurrence of an ingrown nails been going on for couple of months she's tried self-care debridement soaking with little or no success. She's concerned about infections and she's had knee replacements and right side. Has had previous nail excision and phenol matricectomy however appears to be a possible regrowth of the lateral border the right great toe.  H objective findings reveal pedal pulses palpable DP postal for PT plus one over 4 bilateral capillary refill time 3 seconds epicritic and proprioceptive sensations intact and symmetric bilateral there is normal plantar response DTRs not listed dermatologically skin color pigment normal hair growth absent nails criptotic of her hallux nails lotion signs of medial lateral border excisions were there may be a regrowth or spicule of the lateral border of the right great toe. There is edema and granulation tissue proud flesh the proximal lateral nail fold. No ascending cellulitis or lymphangitis noted. Orthopedic biomechanical unremarkable mild flexible digital contractures are noted no other open wounds or ulcerations are identified.       Assessment & Plan:  Assessment paronychia with regrowth of ingrowing  nail lateral border right hallux . At this time recommendation for redo phenol matricectomy excision of possible nail spicule. Local anesthetic in Mr. total of 3 cc 50-50 mixture of 2% Xylocaine plain and 0.5% Marcaine plain and a prep was performed the lateral border is reexcised is thickening identified phenol matricectomy be applied with 3 applications of phenol Q46 seconds each. Alcohol wash followed by Silvadene cream and gauze dressing being applied patient is been given instructions for daily cleansing with antibacterial soap and water and Neosporin bandage application recommended Tylenol as needed for pain. Prescription for Cleocin 100 mg 3 times a day x10 days is issued at this time. Reappointed in 2-3 weeks for followup and nail check. Contact us is any exacerbations in the interim.  Harriet Masson DPM

## 2013-12-28 NOTE — Patient Instructions (Addendum)
ANTIBACTERIAL SOAP INSTRUCTIONS  THE DAY AFTER PROCEDURE  Please follow the instructions your doctor has marked.   Shower as usual. Before getting out, place a drop of antibacterial liquid soap (Dial) on a wet, clean washcloth.  Gently wipe washcloth over affected area.  Afterward, rinse the area with warm water.  Blot the area dry with a soft cloth and cover with antibiotic ointment (neosporin, polysporin, bacitracin) and band aid or gauze and tape  Place 3-4 drops of antibacterial liquid soap in a quart of warm tap water.  Submerge foot into water for 20 minutes.  If bandage was applied after your procedure, leave on to allow for easy lift off, then remove and continue with soak for the remaining time.  Next, blot area dry with a soft cloth and cover with a bandage.  Apply other medications as directed by your doctor, such as cortisporin otic solution (eardrops) or neosporin antibiotic ointment   For postprocedure pain suggest Tylenol 2 tablets 3 times daily as needed for pain

## 2014-01-12 ENCOUNTER — Ambulatory Visit (INDEPENDENT_AMBULATORY_CARE_PROVIDER_SITE_OTHER): Payer: BC Managed Care – PPO

## 2014-01-12 VITALS — BP 105/67 | HR 81 | Resp 18

## 2014-01-12 DIAGNOSIS — Z09 Encounter for follow-up examination after completed treatment for conditions other than malignant neoplasm: Secondary | ICD-10-CM

## 2014-01-12 DIAGNOSIS — L03039 Cellulitis of unspecified toe: Secondary | ICD-10-CM

## 2014-01-12 DIAGNOSIS — L600 Ingrowing nail: Secondary | ICD-10-CM

## 2014-01-12 MED ORDER — SULFAMETHOXAZOLE-TMP DS 800-160 MG PO TABS: 1.0000 | ORAL_TABLET | Freq: Two times a day (BID) | ORAL | Status: DC

## 2014-01-12 NOTE — Progress Notes (Signed)
   Subjective:    Patient ID: Netta Cedars, female    DOB: August 13, 1960, 53 y.o.   MRN: [id number]  HPI MY RIGHT BIG TOENAIL IS FEELING SORE AGAIN AND I HOPE THE NAIL IS NOT COMING BACK    Review of Systems No new findings of systemic changes noted    Objective:   Physical Exam Neurovascular status is intact pedal pulses palpable toe is doing better she completed her antibiotic regimen is to been soaking in Betadine as instructed the proximal lateral nail fold of the right great toe has gotten slightly erythematous again may be some swelling and some slight increased pain in the last day or 2. Cannot rule out possible nail spicule or possibly even regrowth however this time cannot rule out residual infection or localized cellulitis to the nail fold as such patient placed on alternative antibiotic regimen and we'll switch him Betadine to soap and water cleansing and soaking recheck in a week or 2 if it fails to resolve completely      Assessment & Plan:  Assessment status post AP nail procedure right great toe lateral border toes improved although not completely cleared there may be some residual nail spicule or possibly residual infection noted we'll switch soaks and initiated you in a bike regimen recheck in one to 2 weeks if not resolved  Harriet Masson DPM

## 2014-01-12 NOTE — Patient Instructions (Signed)

## 2014-08-10 ENCOUNTER — Ambulatory Visit (INDEPENDENT_AMBULATORY_CARE_PROVIDER_SITE_OTHER): Payer: BC Managed Care – PPO

## 2014-08-10 VITALS — BP 109/76 | HR 97 | Resp 18

## 2014-08-10 DIAGNOSIS — L600 Ingrowing nail: Secondary | ICD-10-CM | POA: Diagnosis not present

## 2014-08-10 DIAGNOSIS — M79675 Pain in left toe(s): Secondary | ICD-10-CM | POA: Diagnosis not present

## 2014-08-10 DIAGNOSIS — L03032 Cellulitis of left toe: Secondary | ICD-10-CM

## 2014-08-10 MED ORDER — CLINDAMYCIN HCL 150 MG PO CAPS: 150.0000 mg | ORAL_CAPSULE | Freq: Three times a day (TID) | ORAL | Status: DC

## 2014-08-10 NOTE — Progress Notes (Signed)
   Subjective:    Patient ID: Valerie Moore, female    DOB: 02-01-1961, 54 y.o.   MRN: [id number]  HPI I HAVE SOME SORNESS AND HURTS SOME ON MY left BIG TOENAIL AND HAS A SPOT ON IT    Review of Systems no new findings or systemic changes    Objective:   Physical Exam Neurovascular status is intact and unchanged pedal pulses are palpable DP +2 PT plus one over 4 Refill time 3 seconds all digits neurologically epicritic appropriate septa sensations intact there is normal plantar response DTRs not elicited patient has had previous nail excision medial border of the left hallux doing well however lateral border has granulation tissue proud flesh edema erythema consistent with either regrowth her nail spicule or new paronychia. Recommend noncontributory rectus foot type mild digital contractures noted no other open wounds ulcers no secondary infections no cyst or tumors are noted rectus foot type otherwise identified       Assessment & Plan:  Assessment ingrowing nail lateral border left great toe with paronychia plan at this time recommended permanent nail excision with phenol matricectomy local block is Mr. to the left great toe Betadine prep performed a lateral border is excised spicule is identified phenol matricectomy followed the speculum border excised phenol resected followed by alcohol wash sitting cream and gauze dressing reapplied at this time to the left great toe lateral border patient is given instructions for daily soaking in antibacterial soap or Betadine in warm water and Epson salts. Also prescription for clindamycin or 50 mg 3 times a day 10 days is issued recheck in 2-3 weeks for follow-up and nail check recommend Neosporin and Band-Aid dressings daily after soaking and Tylenol or Advil as needed for pain next  Harriet Masson DPM

## 2014-08-10 NOTE — Patient Instructions (Signed)
Betadine Soak Instructions  Purchase an 8 oz. bottle of BETADINE solution (Povidone)  THE DAY AFTER THE PROCEDURE  Place 1 tablespoon of betadine solution in a quart of warm tap water.  Submerge your foot or feet with outer bandage intact for the initial soak; this will allow the bandage to become moist and wet for easy lift off.  Once you remove your bandage, continue to soak in the solution for 20 minutes.  This soak should be done twice a day.  Next, remove your foot or feet from solution, blot dry the affected area and cover.  You may use a band aid large enough to cover the area or use gauze and tape.  Apply other medications to the area as directed by the doctor such as cortisporin otic solution (ear drops) or neosporin.  IF YOUR SKIN BECOMES IRRITATED WHILE USING THESE INSTRUCTIONS, IT IS OKAY TO SWITCH TO EPSOM SALTS AND WATER OR WHITE VINEGAR AND WATER.  For postoperative pain recommended Tylenol or ibuprofen as needed for pain

## 2014-08-24 ENCOUNTER — Ambulatory Visit (INDEPENDENT_AMBULATORY_CARE_PROVIDER_SITE_OTHER): Payer: BC Managed Care – PPO

## 2014-08-24 VITALS — BP 121/80 | HR 96 | Resp 12

## 2014-08-24 DIAGNOSIS — L03032 Cellulitis of left toe: Secondary | ICD-10-CM

## 2014-08-24 DIAGNOSIS — Z09 Encounter for follow-up examination after completed treatment for conditions other than malignant neoplasm: Secondary | ICD-10-CM

## 2014-08-24 DIAGNOSIS — L600 Ingrowing nail: Secondary | ICD-10-CM

## 2014-08-24 NOTE — Progress Notes (Signed)
   Subjective:    Patient ID: Valerie Moore, female    DOB: 1960/08/25, 54 y.o.   MRN: [id number]  HPI ''LT FOOT BIG TOENAIL IS LOOKING MUCH BETTER BUT STILL GET A LITTLE SORE.''   Review of Systems no new findings or systemic changes    Objective:   Physical Exam  Neurovascular status intact to some slight edema and erythema lateral nail fold left great toe. Patient otherwise doing well following AP nail procedure the phenol matricectomy slight erythema the nail borders noted mild serous drainage still present no ascending cell is minimal pain or discomfort      Assessment & Plan:  Assessment good postoperative progress patient discharged to an as-needed basis for follow-up continue soaking and Neosporin Band-Aid during day where dry at night contact us this does not heal completely next 3-4 weeks  Harriet Masson DPM

## 2014-08-24 NOTE — Patient Instructions (Signed)

## 2014-09-29 ENCOUNTER — Ambulatory Visit (INDEPENDENT_AMBULATORY_CARE_PROVIDER_SITE_OTHER): Payer: BC Managed Care – PPO

## 2014-09-29 ENCOUNTER — Ambulatory Visit (INDEPENDENT_AMBULATORY_CARE_PROVIDER_SITE_OTHER): Payer: BC Managed Care – PPO | Admitting: Podiatrist

## 2014-09-29 ENCOUNTER — Encounter: Payer: Self-pay | Admitting: Podiatrist

## 2014-09-29 VITALS — BP 128/83 | HR 88 | Resp 18

## 2014-09-29 DIAGNOSIS — L03032 Cellulitis of left toe: Secondary | ICD-10-CM

## 2014-09-29 DIAGNOSIS — R52 Pain, unspecified: Secondary | ICD-10-CM | POA: Diagnosis not present

## 2014-09-29 MED ORDER — SULFAMETHOXAZOLE-TRIMETHOPRIM 800-160 MG PO TABS: 1.0000 | ORAL_TABLET | Freq: Two times a day (BID) | ORAL | Status: DC

## 2014-09-29 NOTE — Patient Instructions (Signed)

## 2014-10-03 NOTE — Progress Notes (Addendum)
Chief Complaint  Patient presents with  . Routine Post Op    my big toenail on my left big toe feels like there is some nail still in there     HPI: Patient is 54 y.o. female who presents today for residual pain status post permanent phenol matrixectomy.    Allergies  Allergen Reactions  . Shellfish Allergy Swelling  . Penicillins Rash    Physical Exam  Patient is awake, alert, and oriented x 3.  In no acute distress.  Vascular status is intact with palpable pedal pulses at 2/4 DP and PT bilateral and capillary refill time within normal limits. Neurological sensation is also intact bilaterally via Semmes Weinstein monofilament at 5/5 sites. Light touch, vibratory sensation, Achilles tendon reflex is intact. Dermatological exam reveals skin color, turger and texture as normal. No open lesions present.  Musculature intact with dorsiflexion, plantarflexion, inversion, eversion.  left hallux nail lateral nail fold is red and tender.  No active pus is seen. xrays are taken and reveal no sign of lytic changes in the distal phalanx.  xrays are normal  Assessment: paronychia left hallux  Plan:Treatment options and alternatives discussed.  Recommended an incision and drainage and patient agreed.  Left hallux was prepped with alcohol and a 1 to 1 mix of 0.5% marcaine plain and 2% lidocaine plain was administered in a digital block fashion.  The toe was then prepped with betadine solution and exsanguinated.  The offending nail border was then excised and all necrotic tissue was resected.  The area was then cleansed well and antibiotic ointment and a dry sterile dressing was applied.  The patient was dispensed instructions for aftercare.

## 2014-10-13 ENCOUNTER — Ambulatory Visit (INDEPENDENT_AMBULATORY_CARE_PROVIDER_SITE_OTHER): Payer: BC Managed Care – PPO | Admitting: Podiatrist

## 2014-10-13 ENCOUNTER — Encounter: Payer: Self-pay | Admitting: Podiatrist

## 2014-10-13 VITALS — BP 101/62 | HR 118 | Resp 18

## 2014-10-13 DIAGNOSIS — L03032 Cellulitis of left toe: Secondary | ICD-10-CM

## 2014-10-13 NOTE — Progress Notes (Signed)
Chief Complaint  Patient presents with  . Routine Post Op    MY LEFT BIG TOENAIL IS DOING OK     HPI: Patient is 54 y.o. female who presents today for folow up status post i and d left great toenail after residual pain and some crust that was non healing status post permanent phenol matrixectomy.    Allergies  Allergen Reactions  . Shellfish Allergy Swelling  . Penicillins Rash    Physical Exam  Patient is awake, alert, and oriented x 3.  In no acute distress.  Vascular status is intact with palpable pedal pulses at 2/4 DP and PT bilateral and capillary refill time within normal limits. Neurological sensation is also intact bilaterally via Semmes Weinstein monofilament at 5/5 sites. Light touch, vibratory sensation, Achilles tendon reflex is intact. Dermatological exam reveals skin color, turger and texture as normal. No open lesions present.  Musculature intact with dorsiflexion, plantarflexion, inversion, eversion.  left hallux nail lateral nail fold is  Healing nicely.  No active pus is seen. Redness has subsided significantly and   Assessment: s/p i and d paronychia right hallux  Plan:recommended keeping open to air at night and applying antibiotic ointment and keeping covered as long as there is any discomfort present.  She will call if any concerns arise.

## 2017-04-21 DIAGNOSIS — K589 Irritable bowel syndrome without diarrhea: Secondary | ICD-10-CM | POA: Insufficient documentation

## 2017-04-21 DIAGNOSIS — I10 Essential (primary) hypertension: Secondary | ICD-10-CM | POA: Insufficient documentation

## 2017-04-21 DIAGNOSIS — E7800 Pure hypercholesterolemia, unspecified: Secondary | ICD-10-CM | POA: Insufficient documentation

## 2017-04-27 DIAGNOSIS — R1030 Lower abdominal pain, unspecified: Secondary | ICD-10-CM | POA: Insufficient documentation

## 2017-04-27 DIAGNOSIS — N838 Other noninflammatory disorders of ovary, fallopian tube and broad ligament: Secondary | ICD-10-CM | POA: Insufficient documentation

## 2017-05-21 DIAGNOSIS — D279 Benign neoplasm of unspecified ovary: Secondary | ICD-10-CM | POA: Insufficient documentation

## 2017-05-21 DIAGNOSIS — Z09 Encounter for follow-up examination after completed treatment for conditions other than malignant neoplasm: Secondary | ICD-10-CM | POA: Insufficient documentation

## 2017-07-23 ENCOUNTER — Other Ambulatory Visit: Payer: Self-pay | Admitting: Orthopedic Surgery

## 2017-07-31 ENCOUNTER — Encounter (HOSPITAL_BASED_OUTPATIENT_CLINIC_OR_DEPARTMENT_OTHER): Payer: Self-pay | Admitting: *Deleted

## 2017-07-31 ENCOUNTER — Other Ambulatory Visit: Payer: Self-pay

## 2017-08-04 ENCOUNTER — Encounter (HOSPITAL_BASED_OUTPATIENT_CLINIC_OR_DEPARTMENT_OTHER)
Admission: RE | Admit: 2017-08-04 | Discharge: 2017-08-04 | Disposition: A | Discharge status: Home / Self Care | Payer: BC Managed Care – PPO | Source: Ambulatory Visit | Attending: Orthopedic Surgery | Admitting: Orthopedic Surgery

## 2017-08-04 DIAGNOSIS — F419 Anxiety disorder, unspecified: Secondary | ICD-10-CM | POA: Diagnosis not present

## 2017-08-04 DIAGNOSIS — G4730 Sleep apnea, unspecified: Secondary | ICD-10-CM | POA: Diagnosis not present

## 2017-08-04 DIAGNOSIS — F329 Major depressive disorder, single episode, unspecified: Secondary | ICD-10-CM | POA: Diagnosis not present

## 2017-08-04 DIAGNOSIS — M21372 Foot drop, left foot: Secondary | ICD-10-CM | POA: Diagnosis not present

## 2017-08-04 DIAGNOSIS — G5732 Lesion of lateral popliteal nerve, left lower limb: Secondary | ICD-10-CM | POA: Diagnosis not present

## 2017-08-04 DIAGNOSIS — E039 Hypothyroidism, unspecified: Secondary | ICD-10-CM | POA: Diagnosis not present

## 2017-08-04 DIAGNOSIS — E785 Hyperlipidemia, unspecified: Secondary | ICD-10-CM | POA: Diagnosis not present

## 2017-08-04 DIAGNOSIS — N189 Chronic kidney disease, unspecified: Secondary | ICD-10-CM | POA: Diagnosis not present

## 2017-08-04 DIAGNOSIS — I129 Hypertensive chronic kidney disease with stage 1 through stage 4 chronic kidney disease, or unspecified chronic kidney disease: Secondary | ICD-10-CM | POA: Diagnosis not present

## 2017-08-04 DIAGNOSIS — K219 Gastro-esophageal reflux disease without esophagitis: Secondary | ICD-10-CM | POA: Diagnosis not present

## 2017-08-04 DIAGNOSIS — M109 Gout, unspecified: Secondary | ICD-10-CM | POA: Diagnosis not present

## 2017-08-04 DIAGNOSIS — Z79899 Other long term (current) drug therapy: Secondary | ICD-10-CM | POA: Diagnosis not present

## 2017-08-04 DIAGNOSIS — Z7982 Long term (current) use of aspirin: Secondary | ICD-10-CM | POA: Diagnosis not present

## 2017-08-04 DIAGNOSIS — E669 Obesity, unspecified: Secondary | ICD-10-CM | POA: Diagnosis not present

## 2017-08-04 DIAGNOSIS — Z6830 Body mass index (BMI) 30.0-30.9, adult: Secondary | ICD-10-CM | POA: Diagnosis not present

## 2017-08-04 LAB — BASIC METABOLIC PANEL
ANION GAP: 11 (ref 5–15)
BUN: 18 mg/dL (ref 6–20)
CALCIUM: 9.6 mg/dL (ref 8.9–10.3)
CHLORIDE: 99 mmol/L — AB (ref 101–111)
CO2: 28 mmol/L (ref 22–32)
Creatinine, Ser: 1.25 mg/dL — ABNORMAL HIGH (ref 0.44–1.00)
GFR calc non Af Amer: 46 mL/min — ABNORMAL LOW (ref >60)
GFR, EST AFRICAN AMERICAN: 54 mL/min — AB (ref >60)
Glucose, Bld: 123 mg/dL — ABNORMAL HIGH (ref 65–99)
POTASSIUM: 4.3 mmol/L (ref 3.5–5.1)
Sodium: 138 mmol/L (ref 135–145)

## 2017-08-04 NOTE — H&P (Signed)
Valerie Moore is an 57 y.o. female.   Chief Complaint: Left leg pain and weakness  HPI: Valerie Moore returns today with continued weakness to the dorsiflexors of her left foot.  EMGs and NCV's have been done with Dr. Mina Marble, clearly showing peroneal nerve compression as the nerve traverses the fibular head.  Patient reports that occasionally she is stumbled and almost fallen.  She has been symptomatic now for 8 weeks.  It is not improving.  It is also not getting much worse.  Past Medical History:  Diagnosis Date  . Anemia   . Anxiety   . Bronchitis 2013 April    bronchitis ... took a round of clarithromycin  and prednisone x 3 days.   . Chronic kidney disease    no NSIADS/anitinflammatory  . Depression   . Environmental allergies   . GERD (gastroesophageal reflux disease)   . Gout   . Headache(784.0)   . Hyperlipemia   . Hypertension   . Hypothyroidism   . Peripheral edema   . PONV (postoperative nausea and vomiting)   . Sleep apnea    uses CPAP nightly    Past Surgical History:  Procedure Laterality Date  . ABDOMINAL HYSTERECTOMY    . CARPAL TUNNEL RELEASE     rt  . CESAREAN SECTION    . CHOLECYSTECTOMY    . QUADRICEPS REPAIR     lft  . ROTATOR CUFF REPAIR     bilateral  . THUMB ARTHROSCOPY  2011   repaired joint .." basal thumb joint"  . TOTAL KNEE ARTHROPLASTY  08/11/2011   Procedure: TOTAL KNEE ARTHROPLASTY;  Surgeon: Kerin Salen, MD;  Location: [facility];  Service: Orthopedics;  Laterality: Right;  DEPUY/ SIGMA   . TOTAL KNEE ARTHROPLASTY  09/22/2011   Procedure: TOTAL KNEE ARTHROPLASTY;  Surgeon: Kerin Salen, MD;  Location: [facility];  Service: Orthopedics;  Laterality: Left;  DEPUY/SIGMA RP    Family History  Problem Relation Age of Onset  . Cancer Mother        bladder  . Hyperlipidemia Mother   . Hypertension Mother   . Hyperlipidemia Brother   . Hypertension Brother   . Anesthesia problems Neg Hx    Social History:  reports that  has never smoked. she has never used  smokeless tobacco. She reports that she does not drink alcohol or use drugs.  Allergies:  Allergies  Allergen Reactions  . Shellfish Allergy Swelling  . Penicillins Rash    No medications prior to admission.    No results found for this or any previous visit (from the past 48 hour(s)). No results found.  Review of Systems  Constitutional: Negative.   HENT: Positive for sinus pain.   Eyes: Negative.   Respiratory: Positive for shortness of breath.   Cardiovascular:       HTN  Gastrointestinal: Negative.   Genitourinary: Negative.   Musculoskeletal: Positive for joint pain.  Skin: Negative.   Neurological: Positive for dizziness.  Endo/Heme/Allergies: Negative.   Psychiatric/Behavioral: The patient has insomnia.     Height 5\' 9"  (1.753 m), weight 93 kg (205 lb), last menstrual period 08/11/2011. Physical Exam  Constitutional: She is oriented to person, place, and time. She appears well-developed and well-nourished.  HENT:  Head: Normocephalic and atraumatic.  Eyes: Pupils are equal, round, and reactive to light.  Neck: Normal range of motion. Neck supple.  Cardiovascular: Intact distal pulses.  Respiratory: Effort normal.  Musculoskeletal:  Patient has weakness to resisted dorsiflexion of left  foot.  Right foot has normal dorsiflexion power  Neurological: She is alert and oriented to person, place, and time.  Skin: Skin is warm and dry.  Psychiatric: She has a normal mood and affect. Her behavior is normal. Judgment and thought content normal.     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  Patient is starting to stumble and fall.  Plan: We will get her set up for peroneal nerve release at the left fibular head.  Risks and benefits of surgery have been discussed questions answered.  I will see her back at the time of surgical intervention at the surgery center.   Valerie Rising, PA-C 08/04/2017, 8:24 AM

## 2017-08-04 NOTE — Progress Notes (Signed)
EKG reviewed by Dr. Seward Speck, will proceed with surgery as scheduled.

## 2017-08-05 ENCOUNTER — Other Ambulatory Visit: Payer: Self-pay

## 2017-08-05 ENCOUNTER — Encounter (HOSPITAL_BASED_OUTPATIENT_CLINIC_OR_DEPARTMENT_OTHER)
Admission: RE | Disposition: A | Discharge status: Home / Self Care | Payer: Self-pay | Source: Ambulatory Visit | Attending: Orthopedic Surgery

## 2017-08-05 ENCOUNTER — Encounter (HOSPITAL_BASED_OUTPATIENT_CLINIC_OR_DEPARTMENT_OTHER): Payer: Self-pay | Admitting: Certified Registered Nurse Anesthetist

## 2017-08-05 ENCOUNTER — Ambulatory Visit (HOSPITAL_BASED_OUTPATIENT_CLINIC_OR_DEPARTMENT_OTHER)
Admission: RE | Admit: 2017-08-05 | Discharge: 2017-08-05 | Disposition: A | Discharge status: Home / Self Care | Payer: BC Managed Care – PPO | Source: Ambulatory Visit | Attending: Orthopedic Surgery | Admitting: Orthopedic Surgery

## 2017-08-05 ENCOUNTER — Ambulatory Visit (HOSPITAL_BASED_OUTPATIENT_CLINIC_OR_DEPARTMENT_OTHER): Payer: BC Managed Care – PPO | Admitting: Certified Registered Nurse Anesthetist

## 2017-08-05 DIAGNOSIS — M109 Gout, unspecified: Secondary | ICD-10-CM | POA: Insufficient documentation

## 2017-08-05 DIAGNOSIS — N189 Chronic kidney disease, unspecified: Secondary | ICD-10-CM | POA: Insufficient documentation

## 2017-08-05 DIAGNOSIS — Z7982 Long term (current) use of aspirin: Secondary | ICD-10-CM | POA: Insufficient documentation

## 2017-08-05 DIAGNOSIS — G5732 Lesion of lateral popliteal nerve, left lower limb: Secondary | ICD-10-CM | POA: Diagnosis present

## 2017-08-05 DIAGNOSIS — F419 Anxiety disorder, unspecified: Secondary | ICD-10-CM | POA: Insufficient documentation

## 2017-08-05 DIAGNOSIS — E039 Hypothyroidism, unspecified: Secondary | ICD-10-CM | POA: Insufficient documentation

## 2017-08-05 DIAGNOSIS — E785 Hyperlipidemia, unspecified: Secondary | ICD-10-CM | POA: Insufficient documentation

## 2017-08-05 DIAGNOSIS — K219 Gastro-esophageal reflux disease without esophagitis: Secondary | ICD-10-CM | POA: Insufficient documentation

## 2017-08-05 DIAGNOSIS — Z6830 Body mass index (BMI) 30.0-30.9, adult: Secondary | ICD-10-CM | POA: Insufficient documentation

## 2017-08-05 DIAGNOSIS — E669 Obesity, unspecified: Secondary | ICD-10-CM | POA: Insufficient documentation

## 2017-08-05 DIAGNOSIS — M21372 Foot drop, left foot: Secondary | ICD-10-CM | POA: Insufficient documentation

## 2017-08-05 DIAGNOSIS — Z79899 Other long term (current) drug therapy: Secondary | ICD-10-CM | POA: Insufficient documentation

## 2017-08-05 DIAGNOSIS — F329 Major depressive disorder, single episode, unspecified: Secondary | ICD-10-CM | POA: Insufficient documentation

## 2017-08-05 DIAGNOSIS — I129 Hypertensive chronic kidney disease with stage 1 through stage 4 chronic kidney disease, or unspecified chronic kidney disease: Secondary | ICD-10-CM | POA: Insufficient documentation

## 2017-08-05 DIAGNOSIS — G4730 Sleep apnea, unspecified: Secondary | ICD-10-CM | POA: Insufficient documentation

## 2017-08-05 HISTORY — DX: Sleep apnea, unspecified: G47.30

## 2017-08-05 HISTORY — DX: Localized edema: R60.0

## 2017-08-05 HISTORY — DX: Anxiety disorder, unspecified: F41.9

## 2017-08-05 HISTORY — DX: Other allergy status, other than to drugs and biological substances: Z91.09

## 2017-08-05 HISTORY — PX: SUPERFICIAL PERONEAL NERVE RELEASE: SHX6200

## 2017-08-05 HISTORY — DX: Edema, unspecified: R60.9

## 2017-08-05 SURGERY — DECOMPRESSION, NERVE, SUPERFICIAL PERONEAL
Anesthesia: General | Site: Leg Lower | Laterality: Left

## 2017-08-05 MED ORDER — SCOPOLAMINE 1 MG/3DAYS TD PT72
MEDICATED_PATCH | TRANSDERMAL | Status: AC
  Filled 2017-08-05: qty 1

## 2017-08-05 MED ORDER — LACTATED RINGERS IV SOLN
INTRAVENOUS | Status: DC
  Administered 2017-08-05: 11:00:00 via INTRAVENOUS

## 2017-08-05 MED ORDER — METOCLOPRAMIDE HCL 5 MG/ML IJ SOLN: 10.0000 mg | Freq: Once | INTRAMUSCULAR | Status: DC | PRN

## 2017-08-05 MED ORDER — LIDOCAINE HCL (CARDIAC) 20 MG/ML IV SOLN
INTRAVENOUS | Status: AC
  Filled 2017-08-05: qty 5

## 2017-08-05 MED ORDER — FENTANYL CITRATE (PF) 100 MCG/2ML IJ SOLN
INTRAMUSCULAR | Status: DC | PRN
  Administered 2017-08-05: 100 ug via INTRAVENOUS

## 2017-08-05 MED ORDER — MIDAZOLAM HCL 2 MG/2ML IJ SOLN: 1.0000 mg | INTRAMUSCULAR | Status: DC | PRN

## 2017-08-05 MED ORDER — MIDAZOLAM HCL 2 MG/2ML IJ SOLN
INTRAMUSCULAR | Status: AC
  Filled 2017-08-05: qty 2

## 2017-08-05 MED ORDER — BUPIVACAINE-EPINEPHRINE 0.25% -1:200000 IJ SOLN
INTRAMUSCULAR | Status: AC
  Filled 2017-08-05: qty 1

## 2017-08-05 MED ORDER — DIPHENHYDRAMINE HCL 50 MG/ML IJ SOLN
INTRAMUSCULAR | Status: AC
  Filled 2017-08-05: qty 1

## 2017-08-05 MED ORDER — DEXAMETHASONE SODIUM PHOSPHATE 10 MG/ML IJ SOLN
INTRAMUSCULAR | Status: DC | PRN
  Administered 2017-08-05: 10 mg via INTRAVENOUS

## 2017-08-05 MED ORDER — ONDANSETRON HCL 4 MG/2ML IJ SOLN
INTRAMUSCULAR | Status: DC | PRN
  Administered 2017-08-05: 4 mg via INTRAVENOUS

## 2017-08-05 MED ORDER — VANCOMYCIN HCL IN DEXTROSE 1-5 GM/200ML-% IV SOLN
INTRAVENOUS | Status: AC
  Filled 2017-08-05: qty 200

## 2017-08-05 MED ORDER — SCOPOLAMINE 1 MG/3DAYS TD PT72: 1.0000 | MEDICATED_PATCH | Freq: Once | TRANSDERMAL | Status: DC | PRN

## 2017-08-05 MED ORDER — LACTATED RINGERS IV SOLN: INTRAVENOUS | Status: DC

## 2017-08-05 MED ORDER — SUCCINYLCHOLINE CHLORIDE 200 MG/10ML IV SOSY
PREFILLED_SYRINGE | INTRAVENOUS | Status: AC
  Filled 2017-08-05: qty 10

## 2017-08-05 MED ORDER — BUPIVACAINE-EPINEPHRINE (PF) 0.25% -1:200000 IJ SOLN
INTRAMUSCULAR | Status: DC | PRN
  Administered 2017-08-05: 10 mL

## 2017-08-05 MED ORDER — LIDOCAINE 2% (20 MG/ML) 5 ML SYRINGE
INTRAMUSCULAR | Status: DC | PRN
  Administered 2017-08-05: 80 mg via INTRAVENOUS

## 2017-08-05 MED ORDER — CHLORHEXIDINE GLUCONATE 4 % EX LIQD: 60.0000 mL | Freq: Once | CUTANEOUS | Status: DC

## 2017-08-05 MED ORDER — MEPERIDINE HCL 25 MG/ML IJ SOLN: 6.2500 mg | INTRAMUSCULAR | Status: DC | PRN

## 2017-08-05 MED ORDER — MIDAZOLAM HCL 5 MG/5ML IJ SOLN
INTRAMUSCULAR | Status: DC | PRN
  Administered 2017-08-05: 2 mg via INTRAVENOUS

## 2017-08-05 MED ORDER — ONDANSETRON HCL 4 MG/2ML IJ SOLN
INTRAMUSCULAR | Status: AC
  Filled 2017-08-05: qty 2

## 2017-08-05 MED ORDER — FENTANYL CITRATE (PF) 100 MCG/2ML IJ SOLN
INTRAMUSCULAR | Status: AC
  Filled 2017-08-05: qty 2

## 2017-08-05 MED ORDER — EPHEDRINE SULFATE-NACL 50-0.9 MG/10ML-% IV SOSY
PREFILLED_SYRINGE | INTRAVENOUS | Status: DC | PRN
  Administered 2017-08-05 (×2): 10 mg via INTRAVENOUS
  Administered 2017-08-05: 15 mg via INTRAVENOUS

## 2017-08-05 MED ORDER — DIPHENHYDRAMINE HCL 50 MG/ML IJ SOLN
INTRAMUSCULAR | Status: DC | PRN
  Administered 2017-08-05: 6.25 mg via INTRAVENOUS

## 2017-08-05 MED ORDER — VANCOMYCIN HCL IN DEXTROSE 1-5 GM/200ML-% IV SOLN
1000.0000 mg | INTRAVENOUS | Status: AC
  Administered 2017-08-05: 1000 mg via INTRAVENOUS

## 2017-08-05 MED ORDER — HYDROCODONE-ACETAMINOPHEN 7.5-325 MG PO TABS: 1.0000 | ORAL_TABLET | Freq: Once | ORAL | Status: DC | PRN

## 2017-08-05 MED ORDER — FENTANYL CITRATE (PF) 100 MCG/2ML IJ SOLN: 50.0000 ug | INTRAMUSCULAR | Status: DC | PRN

## 2017-08-05 MED ORDER — SUCCINYLCHOLINE CHLORIDE 200 MG/10ML IV SOSY
PREFILLED_SYRINGE | INTRAVENOUS | Status: DC | PRN
  Administered 2017-08-05: 100 mg via INTRAVENOUS

## 2017-08-05 MED ORDER — PHENYLEPHRINE 40 MCG/ML (10ML) SYRINGE FOR IV PUSH (FOR BLOOD PRESSURE SUPPORT)
PREFILLED_SYRINGE | INTRAVENOUS | Status: DC | PRN
  Administered 2017-08-05 (×2): 80 ug via INTRAVENOUS

## 2017-08-05 MED ORDER — FENTANYL CITRATE (PF) 100 MCG/2ML IJ SOLN: 25.0000 ug | INTRAMUSCULAR | Status: DC | PRN

## 2017-08-05 MED ORDER — HYDROCODONE-ACETAMINOPHEN 5-325 MG PO TABS: 1.0000 | ORAL_TABLET | Freq: Four times a day (QID) | ORAL | 0 refills | Status: DC | PRN

## 2017-08-05 MED ORDER — PROPOFOL 10 MG/ML IV BOLUS
INTRAVENOUS | Status: AC
  Filled 2017-08-05: qty 20

## 2017-08-05 MED ORDER — DEXAMETHASONE SODIUM PHOSPHATE 10 MG/ML IJ SOLN
INTRAMUSCULAR | Status: AC
  Filled 2017-08-05: qty 1

## 2017-08-05 MED ORDER — PROPOFOL 10 MG/ML IV BOLUS
INTRAVENOUS | Status: DC | PRN
  Administered 2017-08-05: 180 mg via INTRAVENOUS

## 2017-08-05 SURGICAL SUPPLY — 57 items
BANDAGE ACE 4X5 VEL STRL LF (GAUZE/BANDAGES/DRESSINGS) ×3 IMPLANT
BANDAGE ACE 6X5 VEL STRL LF (GAUZE/BANDAGES/DRESSINGS) ×3 IMPLANT
BANDAGE ESMARK 6X9 LF (GAUZE/BANDAGES/DRESSINGS) IMPLANT
BLADE SURG 15 STRL LF DISP TIS (BLADE) ×1 IMPLANT
BLADE SURG 15 STRL SS (BLADE) ×2
BNDG ESMARK 6X9 LF (GAUZE/BANDAGES/DRESSINGS)
CHLORAPREP W/TINT 26ML (MISCELLANEOUS) ×3 IMPLANT
CORD BIPOLAR FORCEPS 12FT (ELECTRODE) ×3 IMPLANT
COVER BACK TABLE 60X90IN (DRAPES) ×3 IMPLANT
COVER MAYO STAND STRL (DRAPES) IMPLANT
CUFF TOURNIQUET SINGLE 34IN LL (TOURNIQUET CUFF) ×3 IMPLANT
DECANTER SPIKE VIAL GLASS SM (MISCELLANEOUS) IMPLANT
DRAPE EXTREMITY T 121X128X90 (DRAPE) ×3 IMPLANT
DRAPE IMP U-DRAPE 54X76 (DRAPES) ×3 IMPLANT
DRAPE U-SHAPE 47X51 STRL (DRAPES) ×3 IMPLANT
DURAPREP 26ML APPLICATOR (WOUND CARE) IMPLANT
ELECT REM PT RETURN 9FT ADLT (ELECTROSURGICAL) ×3
ELECTRODE REM PT RTRN 9FT ADLT (ELECTROSURGICAL) ×1 IMPLANT
GAUZE XEROFORM 1X8 LF (GAUZE/BANDAGES/DRESSINGS) ×3 IMPLANT
GLOVE BIO SURGEON STRL SZ7 (GLOVE) ×3 IMPLANT
GLOVE BIO SURGEON STRL SZ7.5 (GLOVE) ×3 IMPLANT
GLOVE BIO SURGEON STRL SZ8.5 (GLOVE) ×3 IMPLANT
GLOVE BIOGEL PI IND STRL 8 (GLOVE) ×1 IMPLANT
GLOVE BIOGEL PI IND STRL 9 (GLOVE) ×1 IMPLANT
GLOVE BIOGEL PI INDICATOR 8 (GLOVE) ×2
GLOVE BIOGEL PI INDICATOR 9 (GLOVE) ×2
GOWN STRL REUS W/ TWL XL LVL3 (GOWN DISPOSABLE) ×1 IMPLANT
GOWN STRL REUS W/TWL XL LVL3 (GOWN DISPOSABLE) ×8 IMPLANT
NDL SAFETY ECLIPSE 18X1.5 (NEEDLE) ×1 IMPLANT
NEEDLE HYPO 18GX1.5 SHARP (NEEDLE) ×2
NEEDLE HYPO 22GX1.5 SAFETY (NEEDLE) ×3 IMPLANT
PACK BASIN DAY SURGERY FS (CUSTOM PROCEDURE TRAY) ×3 IMPLANT
PAD CAST 4YDX4 CTTN HI CHSV (CAST SUPPLIES) ×1 IMPLANT
PADDING CAST COTTON 4X4 STRL (CAST SUPPLIES) ×2
PADDING CAST COTTON 6X4 STRL (CAST SUPPLIES) IMPLANT
PENCIL BUTTON HOLSTER BLD 10FT (ELECTRODE) IMPLANT
SPLINT FAST PLASTER 5X30 (CAST SUPPLIES)
SPLINT PLASTER CAST FAST 5X30 (CAST SUPPLIES) IMPLANT
SPONGE LAP 4X18 X RAY DECT (DISPOSABLE) ×3 IMPLANT
STOCKINETTE 6  STRL (DRAPES) ×2
STOCKINETTE 6 STRL (DRAPES) ×1 IMPLANT
SUCTION FRAZIER HANDLE 10FR (MISCELLANEOUS) ×2
SUCTION TUBE FRAZIER 10FR DISP (MISCELLANEOUS) ×1 IMPLANT
SUT ETHILON 4 0 PS 2 18 (SUTURE) IMPLANT
SUT VIC AB 0 SH 27 (SUTURE) IMPLANT
SUT VIC AB 1 CT1 27 (SUTURE)
SUT VIC AB 1 CT1 27XBRD ANBCTR (SUTURE) IMPLANT
SUT VIC AB 2-0 SH 27 (SUTURE)
SUT VIC AB 2-0 SH 27XBRD (SUTURE) IMPLANT
SUT VIC AB 3-0 SH 27 (SUTURE) ×2
SUT VIC AB 3-0 SH 27X BRD (SUTURE) ×1 IMPLANT
SYR BULB 3OZ (MISCELLANEOUS) ×3 IMPLANT
SYR CONTROL 10ML LL (SYRINGE) ×3 IMPLANT
TOWEL OR 17X24 6PK STRL BLUE (TOWEL DISPOSABLE) ×6 IMPLANT
TUBE CONNECTING 20'X1/4 (TUBING) ×1
TUBE CONNECTING 20X1/4 (TUBING) ×2 IMPLANT
UNDERPAD 30X30 (UNDERPADS AND DIAPERS) IMPLANT

## 2017-08-05 NOTE — Op Note (Signed)
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 Barton Dubois  (present throughout entire procedure and necessary for timely completion of the procedure)  Anesthesia: General  EBL: Minimal  Fluids: 800 cc  Tourniquet Time: None  Indications: 57 year old woman who presented to my office with a foot drop and weakness to the dorsiflexors of the left leg a few months ago.  Symptoms did not resolve EMGs and MCVs were accomplished showing that she had loss of nerve velocity and amplitude at the left fibular head consistent with peroneal nerve impingement.  After 3 months of observation with no improvement she desires elective peroneal nerve release at the fibular head.  She also has a little bit of numbness dorsally to the foot between the first and second ray.  Risks and benefits of surgery been discussed and all questions answered.  Procedure: Patient was identified by armband and received preoperative vancomycin in the holding area at the day surgery center.  Taken to operating room 3 appropriate anesthetic monitors were attached and general LMA anesthesia induced with the patient in the supine position on a beanbag.  She was then placed in the 45 degree lateral decubitus position right side down tourniquet applied to the left side but never used in the left lower extremity prepped and draped in the usual sterile fashion from the ankle to the tourniquet.  Timeout procedure was performed we made a 4 cm incision longitudinal centered over the posterior lateral border of the left fibular head going distally.  Small bleeders in the skin and subcutaneous tissue identified and cauterized we identified the fascia over the lateral compartment and the entry point of the nerve below the fascia just distal to the fibular head.  Using pickups and tenotomy scissors we then  incised the fascia over the entry point of the peroneal nerve and partial release the muscle over the nerve going about 8-9 mm distally and anteriorly.  We also released proximally and then visually inspected and palpated the nerve and found it to be intact.  Small bleeders were cauterized with the bipolar.  We then closed the subcutaneous tissue only with 3-0 Vicryl suture in the subcuticular tissues with 3-0 Vicryl suture followed by dressing of Xeroform 4 x 4 dressing sponges web roll and an Ace wrap.  Patient was then rolled supine awaken extubated and taken to the recovery room without difficulty.

## 2017-08-05 NOTE — Anesthesia Procedure Notes (Signed)
Procedure Name: Intubation Date/Time: 08/05/2017 11:51 AM Performed by: Genelle Bal, CRNA Pre-anesthesia Checklist: Patient identified, Emergency Drugs available, Suction available and Patient being monitored Patient Re-evaluated:Patient Re-evaluated prior to induction Oxygen Delivery Method: Circle system utilized Preoxygenation: Pre-oxygenation with 100% oxygen Induction Type: IV induction Ventilation: Mask ventilation without difficulty Laryngoscope Size: Miller and 2 Grade View: Grade I Tube type: Oral Tube size: 7.0 mm Number of attempts: 1 Airway Equipment and Method: Stylet and Oral airway Placement Confirmation: ETT inserted through vocal cords under direct vision,  positive ETCO2 and breath sounds checked- equal and bilateral Secured at: 20 cm Tube secured with: Tape Dental Injury: Teeth and Oropharynx as per pre-operative assessment

## 2017-08-05 NOTE — Anesthesia Preprocedure Evaluation (Signed)
Anesthesia Evaluation  Patient identified by MRN, date of birth, ID band Patient awake    Reviewed: Allergy & Precautions, NPO status , Patient's Chart, lab work & pertinent test results, reviewed documented beta blocker date and time   History of Anesthesia Complications (+) PONV and history of anesthetic complications  Airway Mallampati: II  TM Distance: >3 FB Neck ROM: Full    Dental no notable dental hx. (+) Teeth Intact   Pulmonary neg pulmonary ROS, sleep apnea and Continuous Positive Airway Pressure Ventilation ,    Pulmonary exam normal breath sounds clear to auscultation       Cardiovascular hypertension, Pt. on medications Normal cardiovascular exam Rhythm:Regular Rate:Normal     Neuro/Psych  Headaches, PSYCHIATRIC DISORDERS Anxiety Depression Superficial peroneal nerve entrapment  Neuromuscular disease    GI/Hepatic Neg liver ROS, GERD  Medicated and Controlled,  Endo/Other  Hypothyroidism Obesity  Renal/GU Renal InsufficiencyRenal disease  negative genitourinary   Musculoskeletal  (+) Arthritis , Osteoarthritis,  S/P Bilateral TKR   Abdominal (+) + obese,   Peds  Hematology  (+) anemia ,   Anesthesia Other Findings   Reproductive/Obstetrics                             Anesthesia Physical Anesthesia Plan  ASA: III  Anesthesia Plan: General   Post-op Pain Management:    Induction: Intravenous  PONV Risk Score and Plan: 4 or greater and Ondansetron, Dexamethasone, Midazolam, Scopolamine patch - Pre-op and Treatment may vary due to age or medical condition  Airway Management Planned: LMA  Additional Equipment:   Intra-op Plan:   Post-operative Plan: Extubation in OR  Informed Consent: I have reviewed the patients History and Physical, chart, labs and discussed the procedure including the risks, benefits and alternatives for the proposed anesthesia with the patient or  authorized representative who has indicated his/her understanding and acceptance.   Dental advisory given  Plan Discussed with: CRNA, Anesthesiologist and Surgeon  Anesthesia Plan Comments:         Anesthesia Quick Evaluation

## 2017-08-05 NOTE — Anesthesia Postprocedure Evaluation (Signed)
Anesthesia Post Note  Patient: Valerie Moore  Procedure(s) Performed: SUPERFICIAL PERONEAL NERVE RELEASE AT [place] (Left Leg Lower)     Patient location during evaluation: PACU Anesthesia Type: General Level of consciousness: awake and alert and oriented Pain management: pain level controlled Vital Signs Assessment: post-procedure vital signs reviewed and stable Respiratory status: spontaneous breathing, nonlabored ventilation and respiratory function stable Cardiovascular status: blood pressure returned to baseline and stable Postop Assessment: no apparent nausea or vomiting Anesthetic complications: no    Last Vitals:  Vitals:   08/05/17 1245 08/05/17 1300  BP: 132/73 121/77  Pulse: 87 87  Resp: 15 14  Temp:    SpO2: 100% 97%    Last Pain:  Vitals:   08/05/17 1300  TempSrc:   PainSc: 0-No pain    LLE Motor Response: Purposeful movement (08/05/17 1300) LLE Sensation: Full sensation (08/05/17 1300)          Erabella Kuipers A.

## 2017-08-05 NOTE — Transfer of Care (Signed)
Immediate Anesthesia Transfer of Care Note  Patient: Valerie Moore  Procedure(s) Performed: SUPERFICIAL PERONEAL NERVE RELEASE AT FIBULAR HEAD (Left Leg Lower)  Patient Location: PACU  Anesthesia Type:General  Level of Consciousness: awake, alert  and oriented  Airway & Oxygen Therapy: Patient Spontanous Breathing and Patient connected to face mask oxygen  Post-op Assessment: Report given to RN and Post -op Vital signs reviewed and stable  Post vital signs: Reviewed and stable  Last Vitals:  Vitals:   08/05/17 1037  BP: 122/74  Pulse: 72  Resp: 16  Temp: 36.8 C  SpO2: 98%    Last Pain:  Vitals:   08/05/17 1037  TempSrc: Oral      Patients Stated Pain Goal: 4 (37/[id number])  Complications: No apparent anesthesia complications

## 2017-08-05 NOTE — Interval H&P Note (Signed)
History and Physical Interval Note:  08/05/2017 11:43 AM  Valerie Moore  has presented today for surgery, with the diagnosis of LEFT PERONEAL NERVE COMPRESSION  The various methods of treatment have been discussed with the patient and family. After consideration of risks, benefits and other options for treatment, the patient has consented to  Procedure(s): [place] (Left) as a surgical intervention .  The patient's history has been reviewed, patient examined, no change in status, stable for surgery.  I have reviewed the patient's chart and labs.  Questions were answered to the patient's satisfaction.     Kerin Salen

## 2017-08-05 NOTE — Discharge Instructions (Signed)
°  Post Anesthesia Home Care Instructions  Activity: Get plenty of rest for the remainder of the day. A responsible individual must stay with you for 24 hours following the procedure.  For the next 24 hours, DO NOT: -Drive a car -Operate machinery -Drink alcoholic beverages -Take any medication unless instructed by your physician -Make any legal decisions or sign important papers.  Meals: Start with liquid foods such as gelatin or soup. Progress to regular foods as tolerated. Avoid greasy, spicy, heavy foods. If nausea and/or vomiting occur, drink only clear liquids until the nausea and/or vomiting subsides. Call your physician if vomiting continues.  Special Instructions/Symptoms: Your throat may feel dry or sore from the anesthesia or the breathing tube placed in your throat during surgery. If this causes discomfort, gargle with warm salt water. The discomfort should disappear within 24 hours.  If you had a scopolamine patch placed behind your ear for the management of post- operative nausea and/or vomiting:  1. The medication in the patch is effective for 72 hours, after which it should be removed.  Wrap patch in a tissue and discard in the trash. Wash hands thoroughly with soap and water. 2. You may remove the patch earlier than 72 hours if you experience unpleasant side effects which may include dry mouth, dizziness or visual disturbances. 3. Avoid touching the patch. Wash your hands with soap and water after contact with the patch.    Post Anesthesia Home Care Instructions  Activity: Get plenty of rest for the remainder of the day. A responsible individual must stay with you for 24 hours following the procedure.  For the next 24 hours, DO NOT: -Drive a car -Operate machinery -Drink alcoholic beverages -Take any medication unless instructed by your physician -Make any legal decisions or sign important papers.  Meals: Start with liquid foods such as gelatin or soup. Progress to  regular foods as tolerated. Avoid greasy, spicy, heavy foods. If nausea and/or vomiting occur, drink only clear liquids until the nausea and/or vomiting subsides. Call your physician if vomiting continues.  Special Instructions/Symptoms: Your throat may feel dry or sore from the anesthesia or the breathing tube placed in your throat during surgery. If this causes discomfort, gargle with warm salt water. The discomfort should disappear within 24 hours.  If you had a scopolamine patch placed behind your ear for the management of post- operative nausea and/or vomiting:  1. The medication in the patch is effective for 72 hours, after which it should be removed.  Wrap patch in a tissue and discard in the trash. Wash hands thoroughly with soap and water. 2. You may remove the patch earlier than 72 hours if you experience unpleasant side effects which may include dry mouth, dizziness or visual disturbances. 3. Avoid touching the patch. Wash your hands with soap and water after contact with the patch.    

## 2017-08-06 ENCOUNTER — Encounter (HOSPITAL_BASED_OUTPATIENT_CLINIC_OR_DEPARTMENT_OTHER): Payer: Self-pay | Admitting: Orthopedic Surgery

## 2017-12-08 ENCOUNTER — Ambulatory Visit: Payer: BC Managed Care – PPO | Admitting: Podiatry

## 2018-03-01 ENCOUNTER — Encounter: Payer: Self-pay | Admitting: Cardiology

## 2018-03-10 ENCOUNTER — Other Ambulatory Visit: Payer: Self-pay

## 2018-03-10 DIAGNOSIS — R42 Dizziness and giddiness: Secondary | ICD-10-CM | POA: Insufficient documentation

## 2018-03-10 DIAGNOSIS — E039 Hypothyroidism, unspecified: Secondary | ICD-10-CM | POA: Insufficient documentation

## 2018-03-10 DIAGNOSIS — N950 Postmenopausal bleeding: Secondary | ICD-10-CM | POA: Insufficient documentation

## 2018-04-05 NOTE — Progress Notes (Signed)
Cardiology Office Note:    Date:  04/06/2018   ID:  [name], DOB 1960/09/24, MRN [id number]  PCP:  Ernestene Kiel, MD  Cardiologist:  Shirlee More, MD   Referring MD: Ernestene Kiel, MD  ASSESSMENT:    1. Essential hypertension   2. Orthostatic hypotension   3. Migraine without status migrainosus, not intractable, unspecified migraine type    PLAN:    In order of problems listed above:  1. She has symptomatic orthostatic hypotension influenced by several of her medications including try cyclic antidepressant high-dose loop diuretic and beta-blocker.  I have asked to reduce her antidepressant 50% she takes for migraine reduce her diuretic 50% and fully sodium restrict reduce her beta-blocker 50% hopefully stop as her blood pressure if anything is low anterior use support hose or abdominal binder to maintain her upright blood pressure.  I would not put her on salt tablets because of her chronic edema.  If refractory alpha agonist like midodrine is an option.  I do not think she requires an ambulatory heart rhythm monitor cardiac imaging. 2. See above symptomatic influenced by multiple medication she is taking for hypertension edema and migraine headache 3. Stable try to decrease the dose of medication for migraine headache tricyclic antidepressant although I doubt she can stop  Next appointment 6 to 8 weeks   Medication Adjustments/Labs and Tests Ordered: Current medicines are reviewed at length with the patient today.  Concerns regarding medicines are outlined above.  Orders Placed This Encounter  Procedures  . EKG 12-Lead   Meds ordered this encounter  Medications  . furosemide (LASIX) 80 MG tablet    Sig: Take 0.5 tablets (40 mg total) by mouth daily. For swelling.  Marland Kitchen amitriptyline (ELAVIL) 100 MG tablet    Sig: Take 0.5 tablets (50 mg total) by mouth at bedtime.  Marland Kitchen DISCONTD: atenolol (TENORMIN) 50 MG tablet    Sig: Take 0.5 tablets (25 mg total) by mouth daily.   Marland Kitchen atenolol (TENORMIN) 25 MG tablet    Sig: Take 0.5 tablets (12.5 mg total) by mouth daily.     Chief Complaint  Patient presents with  . Dizziness    when I stand    History of Present Illness:    Valerie Moore is a 57 y.o. female who is being seen today for the evaluation of episodes of orthostatic lightheadedness near syncope at the request of Prochnau, Chrys Racer, MD. She has a history for the last 2 to 3 years of orthostatic episodes where she feels as if she may faint sound becomes distant vision points out one occasion she went to the ground but is never lost consciousness.  It particularly occurs after prolonged immobilization.  She takes multiple drugs affecting her orthostatic blood pressure including high-dose loop diuretic for edema high-dose tricyclic antidepressant for migraine beta-blocker for hypertension.  Today in my office her blood pressure is 92/60 standing reproducing her clinical complaint.  She has had no palpitation or syncope no chest pain or shortness of breath she relates she had an echocardiogram done in the last year that was related is normal we will try to access that report and think she had a stress test done several decades ago.  She has taken these medications steadily for the last few years.  My office staff access the echocardiogram done at [facility] on 09/19/2016 it was normal.  Past Medical History:  Diagnosis Date  . Anemia   . Anxiety   . Bronchitis 2013 April  bronchitis ... took a round of clarithromycin  and prednisone x 3 days.   . Chronic kidney disease    no NSIADS/anitinflammatory  . Depression   . Dizziness   . Environmental allergies   . GERD (gastroesophageal reflux disease)   . Gout   . Headache(784.0)   . Hyperlipemia   . Hypertension   . Hypothyroidism   . Migraines   . Peripheral edema   . PONV (postoperative nausea and vomiting)   . Sleep apnea    uses CPAP nightly    Past Surgical History:  Procedure Laterality  Date  . ABDOMINAL HYSTERECTOMY    . CARPAL TUNNEL RELEASE     rt  . CESAREAN SECTION    . CHOLECYSTECTOMY    . QUADRICEPS REPAIR     lft  . ROTATOR CUFF REPAIR     bilateral  . SUPERFICIAL PERONEAL NERVE RELEASE Left 08/05/2017   Procedure: SUPERFICIAL PERONEAL NERVE RELEASE AT FIBULAR HEAD;  Surgeon: Frederik Pear, MD;  Location: [facility];  Service: Orthopedics;  Laterality: Left;  . THUMB ARTHROSCOPY  2011   repaired joint .." basal thumb joint"  . TOTAL KNEE ARTHROPLASTY  08/11/2011   Procedure: TOTAL KNEE ARTHROPLASTY;  Surgeon: Kerin Salen, MD;  Location: [facility];  Service: Orthopedics;  Laterality: Right;  DEPUY/ SIGMA   . TOTAL KNEE ARTHROPLASTY  09/22/2011   Procedure: TOTAL KNEE ARTHROPLASTY;  Surgeon: Kerin Salen, MD;  Location: [facility];  Service: Orthopedics;  Laterality: Left;  DEPUY/SIGMA RP    Current Medications: Current Meds  Medication Sig  . allopurinol (ZYLOPRIM) 300 MG tablet Take 300 mg by mouth at bedtime.   Marland Kitchen amitriptyline (ELAVIL) 100 MG tablet Take 0.5 tablets (50 mg total) by mouth at bedtime.  Marland Kitchen aspirin 81 MG tablet Take 81 mg by mouth daily.  Marland Kitchen atenolol (TENORMIN) 25 MG tablet Take 0.5 tablets (12.5 mg total) by mouth daily.  Marland Kitchen atorvastatin (LIPITOR) 20 MG tablet Take 20 mg by mouth every morning.  . Biotin 5 MG CAPS Take 1 capsule by mouth daily.  . clonazePAM (KLONOPIN) 0.5 MG tablet Take 0.25 mg by mouth at bedtime as needed. For sleep  . CRANBERRY PO Take 4,200 mg by mouth daily.  . cyanocobalamin (,VITAMIN B-12,) 1000 MCG/ML injection Inject 1,000 mcg into the muscle every 30 (thirty) days.   . DULoxetine (CYMBALTA) 30 MG capsule Take 90 mg by mouth at bedtime.   . furosemide (LASIX) 80 MG tablet Take 0.5 tablets (40 mg total) by mouth daily. For swelling.  Marland Kitchen HYDROcodone-acetaminophen (NORCO) 5-325 MG tablet Take 1 tablet by mouth every 6 (six) hours as needed.  Marland Kitchen KLOR-CON M10 10 MEQ tablet Take by mouth 3 (three) times daily.   Marland Kitchen  levalbuterol (XOPENEX) 1.25 MG/0.5ML nebulizer solution Take 1 ampule by nebulization every 4 (four) hours as needed. For shortness of breath/wheezing  . levothyroxine (SYNTHROID) 125 MCG tablet Take 125 mcg by mouth daily before breakfast.  . Magnesium 500 MG TABS Take 1 capsule by mouth daily.   . methocarbamol (ROBAXIN) 500 MG tablet Take 500 mg by mouth every 8 (eight) hours as needed.  . Multiple Vitamin (MULITIVITAMIN WITH MINERALS) TABS Take 1 tablet by mouth daily.  . nitrofurantoin (MACRODANTIN) 100 MG capsule Take 1 capsule by mouth daily as needed.   . nystatin (MYCOSTATIN/NYSTOP) powder Apply 100,000 Units topically 4 (four) times daily.  . pantoprazole (PROTONIX) 40 MG tablet Take 40 mg by mouth 2 (two) times daily.  Marland Kitchen  rizatriptan (MAXALT) 10 MG tablet Take 10 mg by mouth as needed. May repeat in 2 hours if needed  . [DISCONTINUED] amitriptyline (ELAVIL) 100 MG tablet Take 100 mg by mouth at bedtime.  . [DISCONTINUED] atenolol (TENORMIN) 50 MG tablet Take 12.5 mg by mouth 2 (two) times daily.   . [DISCONTINUED] atenolol (TENORMIN) 50 MG tablet Take 0.5 tablets (25 mg total) by mouth daily.  . [DISCONTINUED] furosemide (LASIX) 80 MG tablet Take 80 mg by mouth daily. For swelling.     Allergies:   Shellfish allergy and Penicillins   Social History   Socioeconomic History  . Marital status: Married    Spouse name: Not on file  . Number of children: Not on file  . Years of education: Not on file  . Highest education level: Not on file  Occupational History  . Not on file  Social Needs  . Financial resource strain: Not on file  . Food insecurity:    Worry: Not on file    Inability: Not on file  . Transportation needs:    Medical: Not on file    Non-medical: Not on file  Tobacco Use  . Smoking status: Never Smoker  . Smokeless tobacco: Never Used  Substance and Sexual Activity  . Alcohol use: No  . Drug use: No  . Sexual activity: Yes  Lifestyle  . Physical activity:     Days per week: Not on file    Minutes per session: Not on file  . Stress: Not on file  Relationships  . Social connections:    Talks on phone: Not on file    Gets together: Not on file    Attends religious service: Not on file    Active member of club or organization: Not on file    Attends meetings of clubs or organizations: Not on file    Relationship status: Not on file  Other Topics Concern  . Not on file  Social History Narrative  . Not on file     Family History: The patient's family history includes Cancer in her mother; Hyperlipidemia in her brother and mother; Hypertension in her brother and mother. There is no history of Anesthesia problems.  ROS:   Review of Systems  Constitution: Negative.  HENT: Positive for hearing loss.   Eyes: Positive for visual disturbance.  Cardiovascular: Negative.   Respiratory: Negative.   Endocrine: Negative.   Hematologic/Lymphatic: Negative.   Skin: Negative.   Musculoskeletal: Negative.   Gastrointestinal: Negative.   Genitourinary: Negative.   Neurological: Positive for dizziness, headaches, loss of balance and weakness.  Psychiatric/Behavioral: Negative.   Allergic/Immunologic: Negative.    Please see the history of present illness.     All other systems reviewed and are negative.  EKGs/Labs/Other Studies Reviewed:    The following studies were reviewed today:   EKG:  EKG is  ordered today.  The ekg ordered today demonstrates [place] within normal limits  Recent Labs: 08/04/2017: BUN 18; Creatinine, Ser 1.25; Potassium 4.3; Sodium 138  Recent Lipid Panel No results found for: CHOL, TRIG, HDL, CHOLHDL, VLDL, LDLCALC, LDLDIRECT  Physical Exam:    VS:  BP 92/60 (BP Location: Right Arm, Patient Position: Standing, Cuff Size: Normal)   Pulse 88   Ht 5\' 9"  (1.753 m)   Wt 215 lb 6.4 oz (97.7 kg)   LMP 08/11/2011   SpO2 96%   BMI 31.81 kg/m     Wt Readings from Last 3 Encounters:  04/06/18 215 lb 6.4  oz (97.7 kg)    08/05/17 207 lb (93.9 kg)  02/23/12 205 lb (93 kg)     GEN:  Well nourished, well developed in no acute distress HEENT: Normal NECK: No JVD; No carotid bruits LYMPHATICS: No lymphadenopathy CARDIAC: RRR, no murmurs, rubs, gallops RESPIRATORY:  Clear to auscultation without rales, wheezing or rhonchi  ABDOMEN: Soft, non-tender, non-distended MUSCULOSKELETAL:  No edema; No deformity  SKIN: Warm and dry NEUROLOGIC:  Alert and oriented x 3 PSYCHIATRIC:  Normal affect     Signed, Shirlee More, MD  04/06/2018 1:04 PM    [facility]

## 2018-04-06 ENCOUNTER — Ambulatory Visit: Payer: BC Managed Care – PPO | Admitting: Cardiology

## 2018-04-06 ENCOUNTER — Encounter: Payer: Self-pay | Admitting: Cardiology

## 2018-04-06 VITALS — BP 92/60 | HR 88 | Ht 69.0 in | Wt 215.4 lb

## 2018-04-06 DIAGNOSIS — I951 Orthostatic hypotension: Secondary | ICD-10-CM

## 2018-04-06 DIAGNOSIS — G43909 Migraine, unspecified, not intractable, without status migrainosus: Secondary | ICD-10-CM | POA: Diagnosis not present

## 2018-04-06 DIAGNOSIS — I10 Essential (primary) hypertension: Secondary | ICD-10-CM | POA: Diagnosis not present

## 2018-04-06 MED ORDER — ATENOLOL 25 MG PO TABS: 12.5000 mg | ORAL_TABLET | Freq: Every day | ORAL | Status: AC

## 2018-04-06 MED ORDER — ATENOLOL 50 MG PO TABS: 12.5000 mg | ORAL_TABLET | Freq: Every day | ORAL | Status: DC

## 2018-04-06 MED ORDER — AMITRIPTYLINE HCL 100 MG PO TABS: 50.0000 mg | ORAL_TABLET | Freq: Every day | ORAL | Status: DC

## 2018-04-06 MED ORDER — FUROSEMIDE 80 MG PO TABS: 40.0000 mg | ORAL_TABLET | Freq: Every day | ORAL | Status: AC

## 2018-04-06 NOTE — Patient Instructions (Addendum)
Medication Instructions:  Your physician has recommended you make the following change in your medication:   DECREASE furosemide (lasix) 80 mg: Take 0.5 tablet (40 mg) daily DECREASE amitriptyline (elavil) 100 mg: Take 0.5 tablet (50 mg) daily  DECREASE atenolol (tenormin) 50 mg: Take 0.5 tablet (25 mg) once daily   If you need a refill on your cardiac medications before your next appointment, please call your pharmacy.   Lab work: None  If you have labs (blood work) drawn today and your tests are completely normal, you will receive your results only by: Marland Kitchen MyChart Message (if you have MyChart) OR . A paper copy in the mail If you have any lab test that is abnormal or we need to change your treatment, we will call you to review the results.  Testing/Procedures: You had an EKG today.   Follow-Up: At [facility], you and your health needs are our priority.  As part of our continuing mission to provide you with exceptional heart care, we have created designated Provider Care Teams.  These Care Teams include your primary Cardiologist (physician) and Advanced Practice Providers (APPs -  Physician Assistants and Nurse Practitioners) who all work together to provide you with the care you need, when you need it. . You will need a follow up appointment in 8 weeks.    Any Other Special Instructions Will Be Listed Below (If Applicable).  **Check blood pressure sitting down and standing up daily and record these readings. Please bring this log for Dr. Bettina Gavia to review at your next appointment.   **Drink 3 liters of water daily!  **Purchase support hose or an abdominal binder

## 2018-04-27 ENCOUNTER — Telehealth: Payer: Self-pay | Admitting: Cardiology

## 2018-04-27 NOTE — Telephone Encounter (Signed)
Her problem is orthostatic hypotension she had a systolic 92 when seen by me in the office.  Her headaches are more likely due to migraine and reducing her try cyclic antidepressant she can increase Elavil to his previous dose at this time I would not increase her antihypertensive medications knowing she has marked drop in blood pressure when she stands.

## 2018-04-27 NOTE — Telephone Encounter (Signed)
BP going up since medicine was changed

## 2018-04-27 NOTE — Telephone Encounter (Signed)
Patient called with concern regarding her blood pressure. When she was here last on 04/06/18, several of her medications were decreased due to dizziness. Patient is now having headaches with BP readings as follows: Sunday: sitting 132/83 and standing 113/81, Monday: sitting 154/93 and standing 153/93, today: sitting 147/92 and standing 146/93. Will have Dr. Bettina Gavia advise.

## 2018-04-28 MED ORDER — AMITRIPTYLINE HCL 100 MG PO TABS: 100.0000 mg | ORAL_TABLET | Freq: Every day | ORAL | Status: AC

## 2018-04-28 NOTE — Telephone Encounter (Signed)
Patient informed that Dr. Bettina Gavia does not recommend any further changes to antihypertensive medications as she has orthostatic hypotension. Advised patient to increase elavil to original dose of 100 mg daily to see if that would reduce headaches. Patient verbalized understanding and is agreeable. Patient will contact our office if symptoms worsen or fail to improve.

## 2018-04-28 NOTE — Addendum Note (Signed)
Addended by: Austin Miles on: 04/28/2018 09:57 AM   Modules accepted: Orders

## 2018-05-31 NOTE — Progress Notes (Signed)
Cardiology Office Note:    Date:  06/01/2018   ID:  Valerie Moore, DOB Jun 15, 1960, MRN [id number]  PCP:  Ernestene Kiel, MD  Cardiologist:  Shirlee More, MD    Referring MD: Ernestene Kiel, MD    ASSESSMENT:    1. Orthostatic hypotension   2. Essential hypertension   3. Migraine without status migrainosus, not intractable, unspecified migraine type    PLAN:    In order of problems listed above:  1. Improved home blood pressures are fairly consistently greater than 110 standing but she has intermittent hypertension 1 40-1 50 setting we will place her on a low-dose of ARB and she will hold that if her morning standing is less than 110 or her and her setting is less than 140. 2. See above add a low-dose ARB with hold parameters for hypotension 3. Stable she had to continue her tricyclic antidepressant and tolerates it without significant orthostatic symptomatic hypotension   Next appointment: As needed   Medication Adjustments/Labs and Tests Ordered: Current medicines are reviewed at length with the patient today.  Concerns regarding medicines are outlined above.  No orders of the defined types were placed in this encounter.  Meds ordered this encounter  Medications  . candesartan (ATACAND) 8 MG tablet    Sig: Take 1 tablet daily.  Hold is systolic blood pressure (top number) is less than 110 standing or less than 140 sitting    Dispense:  30 tablet    Refill:  3    Chief Complaint  Patient presents with  . Follow-up  . Hypotension    History of Present Illness:    Valerie Moore is a 58 y.o. female with a hx of hypertension and symptomatic orthostatic hypotension  last seen 04/14/18. Compliance with diet, lifestyle and medications: Yes  She is seen in follow-up to adjustment of medications we discontinued beta-blocker diuretic but had to continue high dose try cyclic antidepressant because of worsening migraine symptomatic hypotension has resolved no dizziness  generally her standing is greater than 110 and generally her sitting is less than 140 but we have added a low-dose beta-blocker with hold parameters to optimize blood pressure control. Past Medical History:  Diagnosis Date  . Anemia   . Anxiety   . Bronchitis 2013 April    bronchitis ... took a round of clarithromycin  and prednisone x 3 days.   . Chronic kidney disease    no NSIADS/anitinflammatory  . Depression   . Dizziness   . Environmental allergies   . GERD (gastroesophageal reflux disease)   . Gout   . Headache(784.0)   . Hyperlipemia   . Hypertension   . Hypothyroidism   . Migraines   . Peripheral edema   . PONV (postoperative nausea and vomiting)   . Sleep apnea    uses CPAP nightly    Past Surgical History:  Procedure Laterality Date  . ABDOMINAL HYSTERECTOMY    . CARPAL TUNNEL RELEASE     rt  . CESAREAN SECTION    . CHOLECYSTECTOMY    . QUADRICEPS REPAIR     lft  . ROTATOR CUFF REPAIR     bilateral  . SUPERFICIAL PERONEAL NERVE RELEASE Left 08/05/2017   Procedure: SUPERFICIAL PERONEAL NERVE RELEASE AT FIBULAR HEAD;  Surgeon: Frederik Pear, MD;  Location: [place];  Service: Orthopedics;  Laterality: Left;  . THUMB ARTHROSCOPY  2011   repaired joint .." basal thumb joint"  . TOTAL KNEE ARTHROPLASTY  08/11/2011  Procedure: TOTAL KNEE ARTHROPLASTY;  Surgeon: Kerin Salen, MD;  Location: [facility];  Service: Orthopedics;  Laterality: Right;  DEPUY/ SIGMA   . TOTAL KNEE ARTHROPLASTY  09/22/2011   Procedure: TOTAL KNEE ARTHROPLASTY;  Surgeon: Kerin Salen, MD;  Location: [facility];  Service: Orthopedics;  Laterality: Left;  DEPUY/SIGMA RP    Current Medications: Current Meds  Medication Sig  . allopurinol (ZYLOPRIM) 300 MG tablet Take 300 mg by mouth at bedtime.   Marland Kitchen amitriptyline (ELAVIL) 100 MG tablet Take 1 tablet (100 mg total) by mouth at bedtime.  Marland Kitchen aspirin 81 MG tablet Take 81 mg by mouth daily.  Marland Kitchen atenolol (TENORMIN) 25 MG tablet Take 0.5  tablets (12.5 mg total) by mouth daily. (Patient taking differently: Take 6.25 mg by mouth daily. )  . atorvastatin (LIPITOR) 20 MG tablet Take 20 mg by mouth every morning.  . Biotin 5 MG CAPS Take 1 capsule by mouth daily.  . clonazePAM (KLONOPIN) 0.5 MG tablet Take 0.25 mg by mouth at bedtime as needed. For sleep  . CRANBERRY PO Take 4,200 mg by mouth daily.  . cyanocobalamin (,VITAMIN B-12,) 1000 MCG/ML injection Inject 1,000 mcg into the muscle every 30 (thirty) days.   . DULoxetine (CYMBALTA) 30 MG capsule Take 90 mg by mouth at bedtime.   . furosemide (LASIX) 80 MG tablet Take 0.5 tablets (40 mg total) by mouth daily. For swelling.  Marland Kitchen HYDROcodone-acetaminophen (NORCO) 5-325 MG tablet Take 1 tablet by mouth every 6 (six) hours as needed.  Marland Kitchen KLOR-CON M10 10 MEQ tablet Take by mouth 3 (three) times daily.   Marland Kitchen levalbuterol (XOPENEX) 1.25 MG/0.5ML nebulizer solution Take 1 ampule by nebulization every 4 (four) hours as needed. For shortness of breath/wheezing  . levothyroxine (SYNTHROID) 125 MCG tablet Take 125 mcg by mouth daily before breakfast.  . Magnesium 500 MG TABS Take 1 capsule by mouth daily.   . methocarbamol (ROBAXIN) 500 MG tablet Take 500 mg by mouth every 8 (eight) hours as needed.  . Multiple Vitamin (MULITIVITAMIN WITH MINERALS) TABS Take 1 tablet by mouth daily.  . nitrofurantoin (MACRODANTIN) 100 MG capsule Take 1 capsule by mouth daily as needed.   . nystatin (MYCOSTATIN/NYSTOP) powder Apply 100,000 Units topically 4 (four) times daily.  . pantoprazole (PROTONIX) 40 MG tablet Take 40 mg by mouth 2 (two) times daily.  . rizatriptan (MAXALT) 10 MG tablet Take 10 mg by mouth as needed. May repeat in 2 hours if needed     Allergies:   Shellfish allergy and Penicillins   Social History   Socioeconomic History  . Marital status: Married    Spouse name: Not on file  . Number of children: Not on file  . Years of education: Not on file  . Highest education level: Not on  file  Occupational History  . Not on file  Social Needs  . Financial resource strain: Not on file  . Food insecurity:    Worry: Not on file    Inability: Not on file  . Transportation needs:    Medical: Not on file    Non-medical: Not on file  Tobacco Use  . Smoking status: Never Smoker  . Smokeless tobacco: Never Used  Substance and Sexual Activity  . Alcohol use: No  . Drug use: No  . Sexual activity: Yes  Lifestyle  . Physical activity:    Days per week: Not on file    Minutes per session: Not on file  . Stress: Not  on file  Relationships  . Social connections:    Talks on phone: Not on file    Gets together: Not on file    Attends religious service: Not on file    Active member of club or organization: Not on file    Attends meetings of clubs or organizations: Not on file    Relationship status: Not on file  Other Topics Concern  . Not on file  Social History Narrative  . Not on file     Family History: The patient's family history includes Cancer in her mother; Hyperlipidemia in her brother and mother; Hypertension in her brother and mother. There is no history of Anesthesia problems. ROS:   Please see the history of present illness.    All other systems reviewed and are negative.  EKGs/Labs/Other Studies Reviewed:    The following studies were reviewed today:    Recent Labs: 08/04/2017: BUN 18; Creatinine, Ser 1.25; Potassium 4.3; Sodium 138  Recent Lipid Panel No results found for: CHOL, TRIG, HDL, CHOLHDL, VLDL, LDLCALC, LDLDIRECT  Physical Exam:    VS:  BP (!) 144/88 (BP Location: Left Arm, Patient Position: Sitting, Cuff Size: Normal)   Pulse 94   Ht 5\' 9"  (1.753 m)   Wt 219 lb (99.3 kg)   LMP 08/11/2011   SpO2 96%   BMI 32.34 kg/m     Wt Readings from Last 3 Encounters:  06/01/18 219 lb (99.3 kg)  04/06/18 215 lb 6.4 oz (97.7 kg)  08/05/17 207 lb (93.9 kg)     GEN:  Well nourished, well developed in no acute distress HEENT:  Normal NECK: No JVD; No carotid bruits LYMPHATICS: No lymphadenopathy CARDIAC: RRR, no murmurs, rubs, gallops RESPIRATORY:  Clear to auscultation without rales, wheezing or rhonchi  ABDOMEN: Soft, non-tender, non-distended MUSCULOSKELETAL:  No edema; No deformity  SKIN: Warm and dry NEUROLOGIC:  Alert and oriented x 3 PSYCHIATRIC:  Normal affect    Signed, Shirlee More, MD  06/01/2018 10:22 AM    [facility]

## 2018-06-01 ENCOUNTER — Encounter: Payer: Self-pay | Admitting: Cardiology

## 2018-06-01 ENCOUNTER — Ambulatory Visit: Payer: BC Managed Care – PPO | Admitting: Cardiology

## 2018-06-01 VITALS — BP 144/88 | HR 94 | Ht 69.0 in | Wt 219.0 lb

## 2018-06-01 DIAGNOSIS — I951 Orthostatic hypotension: Secondary | ICD-10-CM

## 2018-06-01 DIAGNOSIS — G43909 Migraine, unspecified, not intractable, without status migrainosus: Secondary | ICD-10-CM | POA: Insufficient documentation

## 2018-06-01 DIAGNOSIS — I10 Essential (primary) hypertension: Secondary | ICD-10-CM | POA: Diagnosis not present

## 2018-06-01 MED ORDER — CANDESARTAN CILEXETIL 8 MG PO TABS: ORAL_TABLET | ORAL | 3 refills | Status: DC

## 2018-06-01 NOTE — Patient Instructions (Signed)
Medication Instructions:  Your physician has recommended you make the following change in your medication:  START: Candesartan 8mg  take one tablet daily.  Hold if systolic blood pressure (top number) is less than 110 standing or less than 140 sitting.   If you need a refill on your cardiac medications before your next appointment, please call your pharmacy.   Lab work: NONE If you have labs (blood work) drawn today and your tests are completely normal, you will receive your results only by: Marland Kitchen MyChart Message (if you have MyChart) OR . A paper copy in the mail If you have any lab test that is abnormal or we need to change your treatment, we will call you to review the results.  Testing/Procedures: NONE  Follow-Up: At [facility], you and your health needs are our priority.  As part of our continuing mission to provide you with exceptional heart care, we have created designated Provider Care Teams.  These Care Teams include your primary Cardiologist (physician) and Advanced Practice Providers (APPs -  Physician Assistants and Nurse Practitioners) who all work together to provide you with the care you need, when you need it. You will need a follow up appointment as needed or if symptoms worsen or fail to improve.     Candesartan tablets What is this medicine? CANDESARTAN (kan des AR tan) is used to treat high blood pressure in children and adults. This drug is also used to treat adults with heart failure. This medicine may be used for other purposes; ask your health care provider or pharmacist if you have questions. COMMON BRAND NAME(S): Atacand What should I tell my health care provider before I take this medicine? They need to know if you have any of these conditions: -heart failure -kidney or liver disease -if you are on a special diet, such as a low salt diet -an unusual or allergic reaction to candesartan, other medicines, foods, dyes, or preservatives -pregnant or trying to get  pregnant -breast-feeding How should I use this medicine? Take this medicine by mouth with a glass of water. Follow the directions on the prescription label. This medicine can be taken with or without food. Take your doses at regular intervals. Do not take your medicine more often than directed. Talk to your pediatrician regarding the use of this medicine in children. While this drug may be prescribed for children as young as 1 year for selected conditions, precautions do apply. For children who are unable to swallow tablets, the pharmacist can prepare the medicine in an oral suspension. Overdosage: If you think you have taken too much of this medicine contact a poison control center or emergency room at once. NOTE: This medicine is only for you. Do not share this medicine with others. What if I miss a dose? If you miss a dose, take it as soon as you can. If it is almost time for your next dose, take only that dose. Do not take double or extra doses. What may interact with this medicine? -blood pressure medicines -diuretics, especially triamterene, spironolactone, or amiloride -lithium -NSAIDs, medicines for pain and inflammation, like ibuprofen or naproxen -potassium salts or potassium supplements This list may not describe all possible interactions. Give your health care provider a list of all the medicines, herbs, non-prescription drugs, or dietary supplements you use. Also tell them if you smoke, drink alcohol, or use illegal drugs. Some items may interact with your medicine. What should I watch for while using this medicine? Visit your doctor or  health care professional for regular checks on your progress. Check your blood pressure as directed. Ask your doctor or health care professional what your blood pressure should be and when you should contact him or her. Call your doctor or health care professional if you notice an irregular or fast heart beat. Women should inform their doctor if they  wish to become pregnant or think they might be pregnant. There is a potential for serious side effects to an unborn child, particularly in the second or third trimester. Talk to your health care professional or pharmacist for more information. You may get drowsy or dizzy. Do not drive, use machinery, or do anything that needs mental alertness until you know how this drug affects you. Do not stand or sit up quickly, especially if you are an older patient. This reduces the risk of dizzy or fainting spells. Avoid salt substitutes unless you are told otherwise by your doctor or health care professional. Do not treat yourself for coughs, colds, or pain while you are taking this medicine without asking your doctor or health care professional for advice. Some ingredients may increase your blood pressure. What side effects may I notice from receiving this medicine? Side effects that you should report to your doctor or health care professional as soon as possible: -allergic reactions like skin rash, itching or hives, swelling of the face, lips, or tongue -breathing problems -chest pain -decreased amount of urine passed -fast or irregular heart beat -feeling faint or lightheaded, falls -swelling of your hands or feet Side effects that usually do not require medical attention (report to your doctor or health care professional if they continue or are bothersome): -change in sex drive or performance -cough -headache -nausea or stomach pain This list may not describe all possible side effects. Call your doctor for medical advice about side effects. You may report side effects to FDA at 1-800-FDA-1088. Where should I keep my medicine? Keep out of the reach of children. Store at room temperature between 15 and 30 degrees C (59 and 86 degrees F). Protect from light. Keep container tightly closed. Throw away any unused medicine after the expiration date. NOTE: This sheet is a summary. It may not cover all  possible information. If you have questions about this medicine, talk to your doctor, pharmacist, or health care provider.  2019 Elsevier/Gold Standard (2008-03-21 10:07:40)

## 2018-09-25 ENCOUNTER — Other Ambulatory Visit: Payer: Self-pay | Admitting: Cardiology

## 2018-09-27 NOTE — Telephone Encounter (Signed)
Rx refill sent to pharmacy. Put note to pharmacy that pt needs to get future refills from PCP.

## 2019-05-09 ENCOUNTER — Other Ambulatory Visit (HOSPITAL_COMMUNITY): Payer: Self-pay | Admitting: Orthopedic Surgery

## 2019-05-12 ENCOUNTER — Encounter (HOSPITAL_BASED_OUTPATIENT_CLINIC_OR_DEPARTMENT_OTHER): Payer: Self-pay | Admitting: Orthopedic Surgery

## 2019-05-12 ENCOUNTER — Other Ambulatory Visit: Payer: Self-pay

## 2019-05-16 ENCOUNTER — Other Ambulatory Visit: Payer: Self-pay

## 2019-05-16 ENCOUNTER — Other Ambulatory Visit (HOSPITAL_COMMUNITY)
Admission: RE | Admit: 2019-05-16 | Discharge: 2019-05-16 | Disposition: A | Discharge status: Home / Self Care | Payer: BC Managed Care – PPO | Source: Ambulatory Visit | Attending: Orthopedic Surgery | Admitting: Orthopedic Surgery

## 2019-05-16 ENCOUNTER — Encounter (HOSPITAL_BASED_OUTPATIENT_CLINIC_OR_DEPARTMENT_OTHER)
Admission: RE | Admit: 2019-05-16 | Discharge: 2019-05-16 | Disposition: A | Discharge status: Home / Self Care | Payer: BC Managed Care – PPO | Source: Ambulatory Visit | Attending: Orthopedic Surgery | Admitting: Orthopedic Surgery

## 2019-05-16 DIAGNOSIS — M2021 Hallux rigidus, right foot: Secondary | ICD-10-CM | POA: Insufficient documentation

## 2019-05-16 DIAGNOSIS — Z20828 Contact with and (suspected) exposure to other viral communicable diseases: Secondary | ICD-10-CM | POA: Insufficient documentation

## 2019-05-16 DIAGNOSIS — Z01818 Encounter for other preprocedural examination: Secondary | ICD-10-CM | POA: Insufficient documentation

## 2019-05-16 LAB — BASIC METABOLIC PANEL
Anion gap: 8 (ref 5–15)
BUN: 26 mg/dL — ABNORMAL HIGH (ref 6–20)
CO2: 30 mmol/L (ref 22–32)
Calcium: 9.5 mg/dL (ref 8.9–10.3)
Chloride: 101 mmol/L (ref 98–111)
Creatinine, Ser: 1.16 mg/dL — ABNORMAL HIGH (ref 0.44–1.00)
GFR calc Af Amer: 60 mL/min — ABNORMAL LOW (ref >60)
GFR calc non Af Amer: 51 mL/min — ABNORMAL LOW (ref >60)
Glucose, Bld: 102 mg/dL — ABNORMAL HIGH (ref 70–99)
Potassium: 4.4 mmol/L (ref 3.5–5.1)
Sodium: 139 mmol/L (ref 135–145)

## 2019-05-16 LAB — SARS CORONAVIRUS 2 (TAT 6-24 HRS): SARS Coronavirus 2: NEGATIVE

## 2019-05-16 NOTE — Progress Notes (Signed)

## 2019-05-19 ENCOUNTER — Encounter (HOSPITAL_BASED_OUTPATIENT_CLINIC_OR_DEPARTMENT_OTHER): Payer: Self-pay | Admitting: Orthopedic Surgery

## 2019-05-19 ENCOUNTER — Ambulatory Visit (HOSPITAL_BASED_OUTPATIENT_CLINIC_OR_DEPARTMENT_OTHER): Payer: BC Managed Care – PPO | Admitting: Anesthesiology

## 2019-05-19 ENCOUNTER — Other Ambulatory Visit: Payer: Self-pay

## 2019-05-19 ENCOUNTER — Ambulatory Visit (HOSPITAL_BASED_OUTPATIENT_CLINIC_OR_DEPARTMENT_OTHER)
Admission: RE | Admit: 2019-05-19 | Discharge: 2019-05-19 | Disposition: A | Discharge status: Home / Self Care | Payer: BC Managed Care – PPO | Attending: Orthopedic Surgery | Admitting: Orthopedic Surgery

## 2019-05-19 ENCOUNTER — Encounter (HOSPITAL_BASED_OUTPATIENT_CLINIC_OR_DEPARTMENT_OTHER)
Admission: RE | Disposition: A | Discharge status: Home / Self Care | Payer: Self-pay | Source: Home / Self Care | Attending: Orthopedic Surgery

## 2019-05-19 DIAGNOSIS — M2021 Hallux rigidus, right foot: Secondary | ICD-10-CM | POA: Diagnosis not present

## 2019-05-19 DIAGNOSIS — N189 Chronic kidney disease, unspecified: Secondary | ICD-10-CM | POA: Insufficient documentation

## 2019-05-19 DIAGNOSIS — Z79890 Hormone replacement therapy (postmenopausal): Secondary | ICD-10-CM | POA: Insufficient documentation

## 2019-05-19 DIAGNOSIS — K219 Gastro-esophageal reflux disease without esophagitis: Secondary | ICD-10-CM | POA: Insufficient documentation

## 2019-05-19 DIAGNOSIS — F419 Anxiety disorder, unspecified: Secondary | ICD-10-CM | POA: Diagnosis not present

## 2019-05-19 DIAGNOSIS — Z79899 Other long term (current) drug therapy: Secondary | ICD-10-CM | POA: Diagnosis not present

## 2019-05-19 DIAGNOSIS — I129 Hypertensive chronic kidney disease with stage 1 through stage 4 chronic kidney disease, or unspecified chronic kidney disease: Secondary | ICD-10-CM | POA: Diagnosis not present

## 2019-05-19 DIAGNOSIS — E1122 Type 2 diabetes mellitus with diabetic chronic kidney disease: Secondary | ICD-10-CM | POA: Diagnosis not present

## 2019-05-19 DIAGNOSIS — Z96653 Presence of artificial knee joint, bilateral: Secondary | ICD-10-CM | POA: Insufficient documentation

## 2019-05-19 DIAGNOSIS — M1990 Unspecified osteoarthritis, unspecified site: Secondary | ICD-10-CM | POA: Insufficient documentation

## 2019-05-19 DIAGNOSIS — Z7982 Long term (current) use of aspirin: Secondary | ICD-10-CM | POA: Diagnosis not present

## 2019-05-19 DIAGNOSIS — G4730 Sleep apnea, unspecified: Secondary | ICD-10-CM | POA: Diagnosis not present

## 2019-05-19 DIAGNOSIS — G43909 Migraine, unspecified, not intractable, without status migrainosus: Secondary | ICD-10-CM | POA: Diagnosis not present

## 2019-05-19 DIAGNOSIS — E039 Hypothyroidism, unspecified: Secondary | ICD-10-CM | POA: Diagnosis not present

## 2019-05-19 DIAGNOSIS — E785 Hyperlipidemia, unspecified: Secondary | ICD-10-CM | POA: Insufficient documentation

## 2019-05-19 DIAGNOSIS — Z8249 Family history of ischemic heart disease and other diseases of the circulatory system: Secondary | ICD-10-CM | POA: Diagnosis not present

## 2019-05-19 DIAGNOSIS — F329 Major depressive disorder, single episode, unspecified: Secondary | ICD-10-CM | POA: Diagnosis not present

## 2019-05-19 DIAGNOSIS — M109 Gout, unspecified: Secondary | ICD-10-CM | POA: Diagnosis not present

## 2019-05-19 DIAGNOSIS — I10 Essential (primary) hypertension: Secondary | ICD-10-CM | POA: Diagnosis not present

## 2019-05-19 HISTORY — DX: Type 2 diabetes mellitus without complications: E11.9

## 2019-05-19 HISTORY — PX: ARTHRODESIS METATARSALPHALANGEAL JOINT (MTPJ): SHX6566

## 2019-05-19 LAB — GLUCOSE, CAPILLARY
Glucose-Capillary: 73 mg/dL (ref 70–99)
Glucose-Capillary: 77 mg/dL (ref 70–99)

## 2019-05-19 SURGERY — FUSION, JOINT, GREAT TOE
Anesthesia: Monitor Anesthesia Care | Site: Foot | Laterality: Right

## 2019-05-19 MED ORDER — SODIUM CHLORIDE 0.9 % IV SOLN: INTRAVENOUS | Status: DC

## 2019-05-19 MED ORDER — ONDANSETRON HCL 4 MG/2ML IJ SOLN
INTRAMUSCULAR | Status: AC
  Filled 2019-05-19: qty 2

## 2019-05-19 MED ORDER — FENTANYL CITRATE (PF) 100 MCG/2ML IJ SOLN: 25.0000 ug | INTRAMUSCULAR | Status: DC | PRN

## 2019-05-19 MED ORDER — 0.9 % SODIUM CHLORIDE (POUR BTL) OPTIME
TOPICAL | Status: DC | PRN
  Administered 2019-05-19: 120 mL

## 2019-05-19 MED ORDER — PROPOFOL 10 MG/ML IV BOLUS
INTRAVENOUS | Status: AC
  Filled 2019-05-19: qty 20

## 2019-05-19 MED ORDER — MIDAZOLAM HCL 2 MG/2ML IJ SOLN
INTRAMUSCULAR | Status: AC
  Filled 2019-05-19: qty 2

## 2019-05-19 MED ORDER — CEFAZOLIN SODIUM-DEXTROSE 2-4 GM/100ML-% IV SOLN
2.0000 g | INTRAVENOUS | Status: AC
  Administered 2019-05-19: 2 g via INTRAVENOUS

## 2019-05-19 MED ORDER — LACTATED RINGERS IV SOLN: INTRAVENOUS | Status: DC

## 2019-05-19 MED ORDER — VANCOMYCIN HCL 500 MG IV SOLR
INTRAVENOUS | Status: DC | PRN
  Administered 2019-05-19: 500 mg via TOPICAL

## 2019-05-19 MED ORDER — PROPOFOL 10 MG/ML IV BOLUS
INTRAVENOUS | Status: DC | PRN
  Administered 2019-05-19: 50 ug/kg/min via INTRAVENOUS
  Administered 2019-05-19 (×2): 20 mg via INTRAVENOUS

## 2019-05-19 MED ORDER — SENNA 8.6 MG PO TABS: 2.0000 | ORAL_TABLET | Freq: Two times a day (BID) | ORAL | 0 refills | Status: DC

## 2019-05-19 MED ORDER — ONDANSETRON HCL 4 MG/2ML IJ SOLN
INTRAMUSCULAR | Status: DC | PRN
  Administered 2019-05-19: 4 mg via INTRAVENOUS

## 2019-05-19 MED ORDER — CEFAZOLIN SODIUM-DEXTROSE 2-4 GM/100ML-% IV SOLN
INTRAVENOUS | Status: AC
  Filled 2019-05-19: qty 100

## 2019-05-19 MED ORDER — FENTANYL CITRATE (PF) 100 MCG/2ML IJ SOLN
50.0000 ug | INTRAMUSCULAR | Status: DC | PRN
  Administered 2019-05-19 (×2): 50 ug via INTRAVENOUS

## 2019-05-19 MED ORDER — OXYCODONE HCL 5 MG PO TABS: 5.0000 mg | ORAL_TABLET | Freq: Four times a day (QID) | ORAL | 0 refills | Status: AC | PRN

## 2019-05-19 MED ORDER — FENTANYL CITRATE (PF) 100 MCG/2ML IJ SOLN
INTRAMUSCULAR | Status: AC
  Filled 2019-05-19: qty 2

## 2019-05-19 MED ORDER — OXYCODONE HCL 5 MG/5ML PO SOLN: 5.0000 mg | Freq: Once | ORAL | Status: DC | PRN

## 2019-05-19 MED ORDER — ONDANSETRON HCL 4 MG/2ML IJ SOLN: 4.0000 mg | Freq: Once | INTRAMUSCULAR | Status: DC | PRN

## 2019-05-19 MED ORDER — OXYCODONE HCL 5 MG PO TABS: 5.0000 mg | ORAL_TABLET | Freq: Once | ORAL | Status: DC | PRN

## 2019-05-19 MED ORDER — DOCUSATE SODIUM 100 MG PO CAPS: 100.0000 mg | ORAL_CAPSULE | Freq: Every day | ORAL | 2 refills | Status: AC | PRN

## 2019-05-19 MED ORDER — ROPIVACAINE HCL 5 MG/ML IJ SOLN
INTRAMUSCULAR | Status: DC | PRN
  Administered 2019-05-19: 20 mL via PERINEURAL
  Administered 2019-05-19: 25 mL via PERINEURAL

## 2019-05-19 MED ORDER — CHLORHEXIDINE GLUCONATE 4 % EX LIQD: 60.0000 mL | Freq: Once | CUTANEOUS | Status: DC

## 2019-05-19 MED ORDER — MIDAZOLAM HCL 2 MG/2ML IJ SOLN
1.0000 mg | INTRAMUSCULAR | Status: DC | PRN
  Administered 2019-05-19: 07:00:00 2 mg via INTRAVENOUS

## 2019-05-19 SURGICAL SUPPLY — 87 items
BANDAGE ESMARK 6X9 LF (GAUZE/BANDAGES/DRESSINGS) IMPLANT
BIT DRILL 2.7XCANN QCK CNCT (BIT) ×1 IMPLANT
BIT DRILL CANN 2.7 (BIT) ×1
BIT DRILL CANN 2.7MM (BIT) ×1
BIT DRILL Q-C 2.0 DIA 100 (BIT) ×2 IMPLANT
BIT DRILL Q-C 2.0MM DIA 100MM (BIT) ×1
BIT DRL 2.7XCANN QCK CNCT (BIT) ×1
BLADE AVERAGE 25MMX9MM (BLADE)
BLADE AVERAGE 25X9 (BLADE) IMPLANT
BLADE MICRO SAGITTAL (BLADE) IMPLANT
BLADE OSC/SAG .038X5.5 CUT EDG (BLADE) IMPLANT
BLADE SURG 15 STRL LF DISP TIS (BLADE) ×3 IMPLANT
BLADE SURG 15 STRL SS (BLADE) ×6
BNDG COHESIVE 4X5 TAN STRL (GAUZE/BANDAGES/DRESSINGS) IMPLANT
BNDG COHESIVE 6X5 TAN STRL LF (GAUZE/BANDAGES/DRESSINGS) IMPLANT
BNDG CONFORM 3 STRL LF (GAUZE/BANDAGES/DRESSINGS) ×3 IMPLANT
BNDG ELASTIC 4X5.8 VLCR STR LF (GAUZE/BANDAGES/DRESSINGS) ×3 IMPLANT
BNDG ESMARK 6X9 LF (GAUZE/BANDAGES/DRESSINGS)
BOOT STEPPER DURA LG (SOFTGOODS) IMPLANT
BOOT STEPPER DURA MED (SOFTGOODS) ×3 IMPLANT
BOOT STEPPER DURA SM (SOFTGOODS) IMPLANT
BOOT STEPPER DURA XLG (SOFTGOODS) IMPLANT
CHLORAPREP W/TINT 26 (MISCELLANEOUS) ×3 IMPLANT
COVER BACK TABLE REUSABLE LG (DRAPES) ×3 IMPLANT
COVER WAND RF STERILE (DRAPES) IMPLANT
CUFF TOURN SGL QUICK 24 (TOURNIQUET CUFF) ×2
CUFF TOURN SGL QUICK 34 (TOURNIQUET CUFF)
CUFF TRNQT CYL 24X4X16.5-23 (TOURNIQUET CUFF) ×1 IMPLANT
CUFF TRNQT CYL 34X4.125X (TOURNIQUET CUFF) IMPLANT
DRAPE EXTREMITY T 121X128X90 (DISPOSABLE) ×3 IMPLANT
DRAPE HALF SHEET 70X43 (DRAPES) ×3 IMPLANT
DRAPE OEC MINIVIEW 54X84 (DRAPES) ×3 IMPLANT
DRAPE U-SHAPE 47X51 STRL (DRAPES) ×3 IMPLANT
DRSG MEPITEL 4X7.2 (GAUZE/BANDAGES/DRESSINGS) ×3 IMPLANT
DRSG PAD ABDOMINAL 8X10 ST (GAUZE/BANDAGES/DRESSINGS) ×3 IMPLANT
ELECT REM PT RETURN 9FT ADLT (ELECTROSURGICAL) ×3
ELECTRODE REM PT RTRN 9FT ADLT (ELECTROSURGICAL) ×1 IMPLANT
GAUZE SPONGE 4X4 12PLY STRL (GAUZE/BANDAGES/DRESSINGS) ×3 IMPLANT
GLOVE BIO SURGEON STRL SZ7 (GLOVE) ×6 IMPLANT
GLOVE BIO SURGEON STRL SZ8 (GLOVE) ×3 IMPLANT
GLOVE BIOGEL PI IND STRL 7.5 (GLOVE) ×2 IMPLANT
GLOVE BIOGEL PI IND STRL 8 (GLOVE) ×2 IMPLANT
GLOVE BIOGEL PI INDICATOR 7.5 (GLOVE) ×4
GLOVE BIOGEL PI INDICATOR 8 (GLOVE) ×4
GLOVE ECLIPSE 8.0 STRL XLNG CF (GLOVE) ×3 IMPLANT
GOWN STRL REUS W/ TWL LRG LVL3 (GOWN DISPOSABLE) IMPLANT
GOWN STRL REUS W/ TWL XL LVL3 (GOWN DISPOSABLE) ×3 IMPLANT
GOWN STRL REUS W/TWL LRG LVL3 (GOWN DISPOSABLE)
GOWN STRL REUS W/TWL XL LVL3 (GOWN DISPOSABLE) ×6
GUIDEWIRE PIN ORTH 6X1.6XSMTH (WIRE) ×1 IMPLANT
K-WIRE 1.6 (WIRE) ×2
NEEDLE HYPO 22GX1.5 SAFETY (NEEDLE) IMPLANT
PACK BASIN DAY SURGERY FS (CUSTOM PROCEDURE TRAY) ×3 IMPLANT
PAD CAST 4YDX4 CTTN HI CHSV (CAST SUPPLIES) ×1 IMPLANT
PADDING CAST ABS 4INX4YD NS (CAST SUPPLIES)
PADDING CAST ABS COTTON 4X4 ST (CAST SUPPLIES) IMPLANT
PADDING CAST COTTON 4X4 STRL (CAST SUPPLIES) ×2
PADDING CAST COTTON 6X4 STRL (CAST SUPPLIES) IMPLANT
PENCIL SMOKE EVACUATOR (MISCELLANEOUS) ×3 IMPLANT
PLATE TUB 39 W/COLLAR 5H (Plate) ×3 IMPLANT
SANITIZER HAND PURELL 535ML FO (MISCELLANEOUS) ×3 IMPLANT
SCREW 2.7X16MM (Screw) ×2 IMPLANT
SCREW CANN THRD SD 4.0X38 (Screw) ×3 IMPLANT
SCREW CORT 2.5X20X2.7XST SM (Screw) ×1 IMPLANT
SCREW CORTICAL 2.7X20MM (Screw) ×2 IMPLANT
SCREW CORTICAL LOCK 2.7X22 (Screw) ×6 IMPLANT
SCREW NLOCK CORT 2.7X16 NS (Screw) ×1 IMPLANT
SLEEVE SCD COMPRESS KNEE MED (MISCELLANEOUS) ×3 IMPLANT
SPLINT FAST PLASTER 5X30 (CAST SUPPLIES)
SPLINT PLASTER CAST FAST 5X30 (CAST SUPPLIES) IMPLANT
SPONGE LAP 18X18 RF (DISPOSABLE) ×3 IMPLANT
SPONGE SURGIFOAM ABS GEL 12-7 (HEMOSTASIS) IMPLANT
STOCKINETTE 6  STRL (DRAPES) ×2
STOCKINETTE 6 STRL (DRAPES) ×1 IMPLANT
SUCTION FRAZIER HANDLE 10FR (MISCELLANEOUS) ×2
SUCTION TUBE FRAZIER 10FR DISP (MISCELLANEOUS) ×1 IMPLANT
SUT ETHILON 3 0 PS 1 (SUTURE) ×3 IMPLANT
SUT MNCRL AB 3-0 PS2 18 (SUTURE) ×3 IMPLANT
SUT VIC AB 0 SH 27 (SUTURE) IMPLANT
SUT VIC AB 2-0 SH 27 (SUTURE) ×2
SUT VIC AB 2-0 SH 27XBRD (SUTURE) ×1 IMPLANT
SYR BULB 3OZ (MISCELLANEOUS) ×3 IMPLANT
SYR CONTROL 10ML LL (SYRINGE) IMPLANT
TOWEL GREEN STERILE FF (TOWEL DISPOSABLE) ×6 IMPLANT
TUBE CONNECTING 20'X1/4 (TUBING) ×1
TUBE CONNECTING 20X1/4 (TUBING) ×2 IMPLANT
UNDERPAD 30X36 HEAVY ABSORB (UNDERPADS AND DIAPERS) ×3 IMPLANT

## 2019-05-19 NOTE — Op Note (Signed)
05/19/2019  8:23 AM  PATIENT:  Valerie Moore  58 y.o. female  PRE-OPERATIVE DIAGNOSIS:  Right hallux rigidus  POST-OPERATIVE DIAGNOSIS:  Right hallux rigidus  Procedure(s):  1.  Right Hallux Metatarsal Phalangeal Joint Arthrodesis   2.  AP and lateral xrays of the right foot  SURGEON:  Wylene Simmer, MD  ASSISTANT: Mechele Claude, PA-C  ANESTHESIA:   MAC, regional  EBL:  minimal   TOURNIQUET:   Total Tourniquet Time Documented: Calf (Right) - 34 minutes Total: Calf (Right) - 34 minutes  COMPLICATIONS:  None apparent  DISPOSITION:  Extubated, awake and stable to recovery.  INDICATION FOR PROCEDURE: The patient is a 58 year old female with a long history of right forefoot pain due to hallux rigidus.  She has failed nonoperative treatment and presents today for surgical correction of this painful right forefoot condition.  The risks and benefits of the alternative treatment options have been discussed in detail.  The patient wishes to proceed with surgery and specifically understands risks of bleeding, infection, nerve damage, blood clots, need for additional surgery, amputation and death.  PROCEDURE IN DETAIL:  After pre operative consent was obtained, and the correct operative site was identified, the patient was brought to the operating room and placed supine on the OR table.  Anesthesia was administered.  Pre-operative antibiotics were administered.  A surgical timeout was taken.  The right lower extremity was prepped and draped in standard sterile fashion with a tourniquet around the calf.  The extremity was elevated and the tourniquet was inflated to 200 mmHg.  A longitudinal incision was made over the hallux MP joint.  Dissection was carried down through the subcutaneous tissues.  The extensor houses longus and brevis tendons were identified.  They were retracted laterally and protected.  The dorsal joint capsule was incised and elevated medially and laterally.  End-stage arthrosis was  noted at the hallux MP joint with prominent dorsal osteophytes.  These were resected with a rondure.  Collateral ligaments were released exposing the metatarsal head.  A concave reamer was then used to remove the remaining articular cartilage and subchondral bone.  The base of the proximal phalanx was then exposed.  A convex reamer was used to remove the remaining articular cartilage and subchondral bone.  Peripheral osteophytes were removed with a rondure.  The wound was irrigated copiously.  The joint was reduced and provisionally pinned.  AP and lateral radiographs confirmed appropriate alignment of the joint.  A simulated weightbearing exam was performed, and the toe was noted to be appropriately positioned.  The joint was then compressed with a [employer] 4 mm partially-threaded stainless steel cannulated screw.  A 5 hole one quarter tubular plate from the [employer] mini frag set was then contoured to fit the dorsum of the joint.  It was secured proximally with 2 bicortical screws and distally with 2 bicortical screws.  Final AP and lateral radiographs confirmed appropriate position and length of all hardware and appropriate reduction of the hallux MP joint.  The wound was irrigated copiously.  Vancomycin powder was sprinkled into the deep portion of the wound.  The extensor hallucis brevis tendon was repaired to the longus tendon.  The dorsal joint capsule was repaired with 2-0 Vicryl.  Subcutaneous tissues were approximated with 2-0 Vicryl and 3-0 Monocryl.  The skin incision was closed with 3-0 nylon.  Sterile dressings were applied followed by a well-padded cam boot.  The tourniquet was released after application of the dressings.  The patient  was awakened from anesthesia and transported to the recovery room in stable condition.  FOLLOW UP PLAN: Weightbearing as tolerated on the heel in a cam boot.  Follow-up in the office in 2 weeks for suture removal.   RADIOGRAPHS: AP and lateral  radiographs of the right foot are obtained intraoperatively.  These show interval arthrodesis of the hallux MP joint.  Hardware is appropriately positioned and of the appropriate lengths.  No other acute injuries are noted.    Mechele Claude PA-C was present and scrubbed for the duration of the operative case. His assistance was essential in positioning the patient, prepping and draping, gaining and maintaining exposure, performing the operation, closing and dressing the wounds and applying the splint.

## 2019-05-19 NOTE — Anesthesia Preprocedure Evaluation (Addendum)
Anesthesia Evaluation  Patient identified by MRN, date of birth, ID band Patient awake    Reviewed: Allergy & Precautions, NPO status , Patient's Chart, lab work & pertinent test results, reviewed documented beta blocker date and time   History of Anesthesia Complications (+) PONVNegative for: history of anesthetic complications  Airway Mallampati: II  TM Distance: >3 FB Neck ROM: Full    Dental  (+) Teeth Intact   Pulmonary sleep apnea and Continuous Positive Airway Pressure Ventilation ,    Pulmonary exam normal        Cardiovascular hypertension, Pt. on home beta blockers and Pt. on medications Normal cardiovascular exam     Neuro/Psych  Headaches, PSYCHIATRIC DISORDERS Anxiety Depression    GI/Hepatic Neg liver ROS, GERD  ,  Endo/Other  diabetesHypothyroidism   Renal/GU Renal InsufficiencyRenal disease  negative genitourinary   Musculoskeletal  (+) Arthritis ,   Abdominal   Peds  Hematology negative hematology ROS (+)   Anesthesia Other Findings   Reproductive/Obstetrics                           Anesthesia Physical Anesthesia Plan  ASA: III  Anesthesia Plan: MAC and Regional   Post-op Pain Management:  Regional for Post-op pain   Induction: Intravenous  PONV Risk Score and Plan: 3 and Propofol infusion, TIVA and Treatment may vary due to age or medical condition  Airway Management Planned: Natural Airway, Nasal Cannula and Simple Face Mask  Additional Equipment: None  Intra-op Plan:   Post-operative Plan:   Informed Consent: I have reviewed the patients History and Physical, chart, labs and discussed the procedure including the risks, benefits and alternatives for the proposed anesthesia with the patient or authorized representative who has indicated his/her understanding and acceptance.     Dental advisory given  Plan Discussed with:   Anesthesia Plan Comments:         Anesthesia Quick Evaluation

## 2019-05-19 NOTE — Transfer of Care (Signed)
Immediate Anesthesia Transfer of Care Note  Patient: Valerie Moore  Procedure(s) Performed: Right Hallux Metatarsal Phalangeal Joint Arthrodesis (Right Foot)  Patient Location: PACU  Anesthesia Type:MAC combined with regional for post-op pain  Level of Consciousness: sedated  Airway & Oxygen Therapy: Patient Spontanous Breathing and Patient connected to nasal cannula oxygen  Post-op Assessment: Report given to RN and Post -op Vital signs reviewed and stable  Post vital signs: Reviewed and stable  Last Vitals:  Vitals Value Taken Time  BP 144/98 05/19/19 0830  Temp    Pulse 81 05/19/19 0830  Resp 16 05/19/19 0830  SpO2 100 % 05/19/19 0830  Vitals shown include unvalidated device data.  Last Pain:  Vitals:   05/19/19 0640  TempSrc: Temporal  PainSc: 0-No pain         Complications: No apparent anesthesia complications

## 2019-05-19 NOTE — Discharge Instructions (Addendum)
Valerie Simmer, MD [facility]  Please read the following information regarding your care after surgery.  Medications  You only need a prescription for the narcotic pain medicine (ex. oxycodone, Percocet, Norco).  All of the other medicines listed below are available over the counter. X Aleve 2 pills twice a day for the first 3 days after surgery. X acetominophen (Tylenol) 650 mg every 4-6 hours as you need for minor to moderate pain X oxycodone as prescribed for severe pain  Narcotic pain medicine (ex. oxycodone, Percocet, Vicodin) will cause constipation.  To prevent this problem, take the following medicines while you are taking any pain medicine. X docusate sodium (Colace) 100 mg twice a day X senna (Senokot) 2 tablets twice a day  Weight Bearing  X Bear weight only on your operated foot, on your heel, in the CAM boot.   Cast / Splint / Dressing X Keep your splint, cast or dressing clean and dry.  Dont put anything (coat hanger, pencil, etc) down inside of it.  If it gets damp, use a hair dryer on the cool setting to dry it.  If it gets soaked, call the office to schedule an appointment for a cast change.   After your dressing, cast or splint is removed; you may shower, but do not soak or scrub the wound.  Allow the water to run over it, and then gently pat it dry.  Swelling It is normal for you to have swelling where you had surgery.  To reduce swelling and pain, keep your toes above your nose for at least 3 days after surgery.  It may be necessary to keep your foot or leg elevated for several weeks.  If it hurts, it should be elevated.  Follow Up Call my office at [phone] when you are discharged from the hospital or surgery center to schedule an appointment to be seen two weeks after surgery.  Call my office at [phone] if you develop a fever >101.5 F, nausea, vomiting, bleeding from the surgical site or severe pain.     Post Anesthesia Home Care  Instructions  Activity: Get plenty of rest for the remainder of the day. A responsible individual must stay with you for 24 hours following the procedure.  For the next 24 hours, DO NOT: -Drive a car -Paediatric nurse -Drink alcoholic beverages -Take any medication unless instructed by your physician -Make any legal decisions or sign important papers.  Meals: Start with liquid foods such as gelatin or soup. Progress to regular foods as tolerated. Avoid greasy, spicy, heavy foods. If nausea and/or vomiting occur, drink only clear liquids until the nausea and/or vomiting subsides. Call your physician if vomiting continues.  Special Instructions/Symptoms: Your throat may feel dry or sore from the anesthesia or the breathing tube placed in your throat during surgery. If this causes discomfort, gargle with warm salt water. The discomfort should disappear within 24 hours.  If you had a scopolamine patch placed behind your ear for the management of post- operative nausea and/or vomiting:  1. The medication in the patch is effective for 72 hours, after which it should be removed.  Wrap patch in a tissue and discard in the trash. Wash hands thoroughly with soap and water. 2. You may remove the patch earlier than 72 hours if you experience unpleasant side effects which may include dry mouth, dizziness or visual disturbances. 3. Avoid touching the patch. Wash your hands with soap and water after contact with the patch.    Regional  Anesthesia Blocks  1. Numbness or the inability to move the "blocked" extremity may last from 3-48 hours after placement. The length of time depends on the medication injected and your individual response to the medication. If the numbness is not going away after 48 hours, call your surgeon.  2. The extremity that is blocked will need to be protected until the numbness is gone and the  Strength has returned. Because you cannot feel it, you will need to take extra care to  avoid injury. Because it may be weak, you may have difficulty moving it or using it. You may not know what position it is in without looking at it while the block is in effect.  3. For blocks in the legs and feet, returning to weight bearing and walking needs to be done carefully. You will need to wait until the numbness is entirely gone and the strength has returned. You should be able to move your leg and foot normally before you try and bear weight or walk. You will need someone to be with you when you first try to ensure you do not fall and possibly risk injury.  4. Bruising and tenderness at the needle site are common side effects and will resolve in a few days.  5. Persistent numbness or new problems with movement should be communicated to the surgeon or the [place] [phone] [place] [phone]).

## 2019-05-19 NOTE — Progress Notes (Signed)
Assisted Dr. Christella Hartigan with right, ultrasound guided, popliteal/saphenous block. Side rails up, monitors on throughout procedure. See vital signs in flow sheet. Tolerated Procedure well.

## 2019-05-19 NOTE — H&P (Signed)
Valerie Moore is an 58 y.o. female.   Chief Complaint: right foot pain HPI: The patient is a 58 year old female without significant past medical history.  She has a long history of right forefoot pain due to hallux rigidus.  She has failed nonoperative treatment to date including activity modification, shoewear modification and oral anti-inflammatories.  She presents now for surgical treatment of this painful right forefoot condition.  Past Medical History:  Diagnosis Date  . Anemia   . Anxiety   . Bronchitis 2013 April    bronchitis ... took a round of clarithromycin  and prednisone x 3 days.   . Chronic kidney disease    no NSIADS/anitinflammatory  . Depression   . Diabetes mellitus without complication ([place])   . Dizziness   . Environmental allergies   . GERD (gastroesophageal reflux disease)   . Gout   . Headache(784.0)   . Hyperlipemia   . Hypertension   . Hypothyroidism   . Migraines   . Peripheral edema   . PONV (postoperative nausea and vomiting)   . Sleep apnea    uses CPAP nightly    Past Surgical History:  Procedure Laterality Date  . ABDOMINAL HYSTERECTOMY    . CARPAL TUNNEL RELEASE     rt  . CESAREAN SECTION    . CHOLECYSTECTOMY    . QUADRICEPS REPAIR     lft  . ROTATOR CUFF REPAIR     bilateral  . SUPERFICIAL PERONEAL NERVE RELEASE Left 08/05/2017   Procedure: SUPERFICIAL PERONEAL NERVE RELEASE AT FIBULAR HEAD;  Surgeon: Frederik Pear, MD;  Location: [facility];  Service: Orthopedics;  Laterality: Left;  . THUMB ARTHROSCOPY  2011   repaired joint .." basal thumb joint"  . TOTAL KNEE ARTHROPLASTY  08/11/2011   Procedure: TOTAL KNEE ARTHROPLASTY;  Surgeon: Kerin Salen, MD;  Location: [facility];  Service: Orthopedics;  Laterality: Right;  DEPUY/ SIGMA   . TOTAL KNEE ARTHROPLASTY  09/22/2011   Procedure: TOTAL KNEE ARTHROPLASTY;  Surgeon: Kerin Salen, MD;  Location: [facility];  Service: Orthopedics;  Laterality: Left;  DEPUY/SIGMA RP    Family History   Problem Relation Age of Onset  . Cancer Mother        bladder  . Hyperlipidemia Mother   . Hypertension Mother   . Hyperlipidemia Brother   . Hypertension Brother   . Anesthesia problems Neg Hx    Social History:  reports that she has never smoked. She has never used smokeless tobacco. She reports that she does not drink alcohol or use drugs.  Allergies:  Allergies  Allergen Reactions  . Shellfish Allergy Swelling  . Penicillins Rash    Medications Prior to Admission  Medication Sig Dispense Refill  . allopurinol (ZYLOPRIM) 300 MG tablet Take 300 mg by mouth at bedtime.     Marland Kitchen amitriptyline (ELAVIL) 100 MG tablet Take 1 tablet (100 mg total) by mouth at bedtime.    Marland Kitchen aspirin 81 MG tablet Take 81 mg by mouth daily.    Marland Kitchen atenolol (TENORMIN) 25 MG tablet Take 0.5 tablets (12.5 mg total) by mouth daily. (Patient taking differently: Take 6.25 mg by mouth daily. )    . atorvastatin (LIPITOR) 20 MG tablet Take 20 mg by mouth every morning.    . Biotin 5 MG CAPS Take 1 capsule by mouth daily.    . clonazePAM (KLONOPIN) 0.5 MG tablet Take 0.25 mg by mouth at bedtime as needed. For sleep    . CRANBERRY  PO Take 4,200 mg by mouth daily.    . cyanocobalamin (,VITAMIN B-12,) 1000 MCG/ML injection Inject 1,000 mcg into the muscle every 30 (thirty) days.   4  . DULoxetine (CYMBALTA) 30 MG capsule Take 90 mg by mouth at bedtime.     . furosemide (LASIX) 80 MG tablet Take 0.5 tablets (40 mg total) by mouth daily. For swelling.    Marland Kitchen KLOR-CON M10 10 MEQ tablet Take by mouth 3 (three) times daily.     Marland Kitchen levothyroxine (SYNTHROID) 125 MCG tablet Take 125 mcg by mouth daily before breakfast.    . Magnesium 500 MG TABS Take 1 capsule by mouth daily.     . methocarbamol (ROBAXIN) 500 MG tablet Take 500 mg by mouth every 8 (eight) hours as needed.    . Multiple Vitamin (MULITIVITAMIN WITH MINERALS) TABS Take 1 tablet by mouth daily.    . nitrofurantoin (MACRODANTIN) 100 MG capsule Take 1 capsule by mouth  daily as needed.     . nystatin (MYCOSTATIN/NYSTOP) powder Apply 100,000 Units topically 4 (four) times daily.    . pantoprazole (PROTONIX) 40 MG tablet Take 40 mg by mouth 2 (two) times daily.    . rizatriptan (MAXALT) 10 MG tablet Take 10 mg by mouth as needed. May repeat in 2 hours if needed    . Semaglutide (OZEMPIC, 0.25 OR 0.5 MG/DOSE, [place]) Inject into the skin.    . candesartan (ATACAND) 8 MG tablet TAKE 1 TABLET BY MOUTH DAILY. HOLD IF SYSTOLIC IS LESS THAN [id number] STANDING OR LESS THAN 140 SITTING 90 tablet 1  . levalbuterol (XOPENEX) 1.25 MG/0.5ML nebulizer solution Take 1 ampule by nebulization every 4 (four) hours as needed. For shortness of breath/wheezing      Results for orders placed or performed during the hospital encounter of 05/21/2019 (from the past 48 hour(s))  Glucose, capillary     Status: None   Collection Time: 05/21/2019  6:33 AM  Result Value Ref Range   Glucose-Capillary 73 70 - 99 mg/dL   No results found.  Review of Systems no recent fever, chills, nausea, vomiting or changes in her appetite  Blood pressure (!) 153/95, pulse 94, temperature (!) 96.8 F (36 C), temperature source Temporal, resp. rate (!) 24, height 5\' 8"  (1.727 m), weight 87.6 kg, last menstrual period 08/11/2011, SpO2 99 %. Physical Exam  Well-nourished well-developed woman in no apparent distress.  Alert and oriented x4.  Mood and affect are normal.  Extraocular motions are intact.  Respirations are unlabored.  Gait is normal.  The right forefoot has healthy skin.  She has decreased range of motion of the hallux MP joint.  Pulses are palpable in the foot.  Normal sensibility to light touch dorsally and plantarly at the forefoot.  5 out of 5 strength in plantarflexion and dorsiflexion of the ankle and toes.   Assessment/Plan Right hallux rigidus -to the operating room today for hallux MP joint arthrodesis.  The risks and benefits of the alternative treatment options have been discussed in detail.  The  patient wishes to proceed with surgery and specifically understands risks of bleeding, infection, nerve damage, blood clots, need for additional surgery, amputation and death.   Wylene Simmer, MD 2019/05/21, 7:09 AM

## 2019-05-19 NOTE — Anesthesia Procedure Notes (Signed)
Anesthesia Regional Block: Popliteal block   Pre-Anesthetic Checklist: ,, timeout performed, Correct Patient, Correct Site, Correct Laterality, Correct Procedure, Correct Position, site marked, Risks and benefits discussed,  Surgical consent,  Pre-op evaluation,  At surgeon's request and post-op pain management  Laterality: Right  Prep: chloraprep       Needles:  Injection technique: Single-shot  Needle Type: Echogenic Stimulator Needle     Needle Length: 10cm  Needle Gauge: 21     Additional Needles:   Procedures:,,,, ultrasound used (permanent image in chart),,,,  Narrative:  Start time: 05/19/2019 7:13 AM End time: 05/19/2019 7:16 AM Injection made incrementally with aspirations every 5 mL.  Performed by: Personally  Anesthesiologist: Lidia Collum, MD  Additional Notes: Monitors applied. Injection made in 5cc increments. No resistance to injection. Good needle visualization. Patient tolerated procedure well.

## 2019-05-19 NOTE — Anesthesia Procedure Notes (Signed)
Anesthesia Regional Block: Adductor canal block   Pre-Anesthetic Checklist: ,, timeout performed, Correct Patient, Correct Site, Correct Laterality, Correct Procedure, Correct Position, site marked, Risks and benefits discussed,  Surgical consent,  Pre-op evaluation,  At surgeon's request and post-op pain management  Laterality: Right  Prep: chloraprep       Needles:  Injection technique: Single-shot  Needle Type: Echogenic Stimulator Needle     Needle Length: 10cm  Needle Gauge: 21     Additional Needles:   Procedures:,,,, ultrasound used (permanent image in chart),,,,  Narrative:  Start time: 05/19/2019 7:10 AM End time: 05/19/2019 7:13 AM Injection made incrementally with aspirations every 5 mL.  Performed by: Personally  Anesthesiologist: Lidia Collum, MD  Additional Notes: Monitors applied. Injection made in 5cc increments. No resistance to injection. Good needle visualization. Patient tolerated procedure well.

## 2019-05-24 ENCOUNTER — Encounter: Payer: Self-pay | Admitting: Anesthesiology

## 2019-05-24 NOTE — Anesthesia Postprocedure Evaluation (Signed)
Anesthesia Post Note  Patient: SHEILY LINEMAN  Procedure(s) Performed: Right Hallux Metatarsal Phalangeal Joint Arthrodesis (Right Foot)     Patient location during evaluation: PACU Anesthesia Type: Regional and MAC Level of consciousness: awake and alert Pain management: pain level controlled Vital Signs Assessment: post-procedure vital signs reviewed and stable Respiratory status: spontaneous breathing, nonlabored ventilation and respiratory function stable Cardiovascular status: blood pressure returned to baseline and stable Postop Assessment: no apparent nausea or vomiting Anesthetic complications: no    Last Vitals:  Vitals:   05/19/19 0853 05/19/19 0925  BP: (!) 145/91 (!) 171/97  Pulse: 84 87  Resp: 19 16  Temp:  36.7 C  SpO2: 100% 98%    Last Pain:  Vitals:   05/19/19 0925  TempSrc: Oral  PainSc: 0-No pain   Pain Goal:                   Lidia Collum

## 2019-05-26 ENCOUNTER — Encounter: Payer: Self-pay | Admitting: Certified Registered"

## 2019-05-31 ENCOUNTER — Encounter: Payer: Self-pay | Admitting: Certified Registered"

## 2019-05-31 NOTE — Addendum Note (Signed)
Addendum  created 05/31/19 1143 by Tawni Millers, CRNA   Charge Capture section accepted

## 2023-03-09 ENCOUNTER — Other Ambulatory Visit: Payer: Self-pay | Admitting: *Deleted

## 2023-03-09 DIAGNOSIS — I728 Aneurysm of other specified arteries: Secondary | ICD-10-CM

## 2023-03-18 ENCOUNTER — Ambulatory Visit: Payer: BC Managed Care – PPO | Admitting: Vascular Surgery

## 2023-03-18 ENCOUNTER — Ambulatory Visit (HOSPITAL_COMMUNITY)
Admission: RE | Admit: 2023-03-18 | Discharge: 2023-03-18 | Disposition: A | Discharge status: Home / Self Care | Payer: BC Managed Care – PPO | Source: Ambulatory Visit | Attending: Vascular Surgery | Admitting: Vascular Surgery

## 2023-03-18 ENCOUNTER — Encounter: Payer: Self-pay | Admitting: Vascular Surgery

## 2023-03-18 VITALS — BP 137/98 | HR 92 | Temp 98.2°F | Ht 68.0 in | Wt 199.0 lb

## 2023-03-18 DIAGNOSIS — I728 Aneurysm of other specified arteries: Secondary | ICD-10-CM | POA: Insufficient documentation

## 2023-03-18 NOTE — Progress Notes (Signed)
Patient ID: Valerie Moore, female   DOB: 06-Apr-1961, 62 y.o.   MRN: [id number]  Reason for Consult: New Patient (Initial Visit)   Referred by Philemon Kingdom, MD  Subjective:     HPI:  Valerie Moore is a 62 y.o. female was evaluated here over 10 years ago for leg pain.  She is now status post bilateral knee replacements has done very well from these walks without limitation.  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  She denies any new back or abdominal pain but recently asked about her celiac artery and is now sent for referral with ultrasound evaluation.  She states that CT scan was previously denied by her insurance provider.  Past Medical History:  Diagnosis Date   Anemia    Anxiety    Bronchitis 2013 April    bronchitis ... took a round of clarithromycin  and prednisone x 3 days.    Chronic kidney disease    no NSIADS/anitinflammatory   Depression    Diabetes mellitus without complication ([facility])    Dizziness    Environmental allergies    GERD (gastroesophageal reflux disease)    Gout    Headache(784.0)    Hyperlipemia    Hypertension    Hypothyroidism    Migraines    Peripheral edema    PONV (postoperative nausea and vomiting)    Sleep apnea    uses CPAP nightly   Family History  Problem Relation Age of Onset   Cancer Mother        bladder   Hyperlipidemia Mother    Hypertension Mother    Hyperlipidemia Brother    Hypertension Brother    Anesthesia problems Neg Hx    Past Surgical History:  Procedure Laterality Date   ABDOMINAL HYSTERECTOMY     ARTHRODESIS METATARSALPHALANGEAL JOINT (MTPJ) Right 05/19/2019   Procedure: Right Hallux Metatarsal Phalangeal Joint Arthrodesis;  Surgeon: Toni Arthurs, MD;  Location: [facility];  Service: Orthopedics;  Laterality: Right;   CARPAL TUNNEL RELEASE     rt   CESAREAN SECTION     CHOLECYSTECTOMY     QUADRICEPS  REPAIR     lft   ROTATOR CUFF REPAIR     bilateral   SUPERFICIAL PERONEAL NERVE RELEASE Left 08/05/2017   Procedure: SUPERFICIAL PERONEAL NERVE RELEASE AT FIBULAR HEAD;  Surgeon: Gean Birchwood, MD;  Location: [facility];  Service: Orthopedics;  Laterality: Left;   THUMB ARTHROSCOPY  2011   repaired joint .." basal thumb joint"   TOTAL KNEE ARTHROPLASTY  08/11/2011   Procedure: TOTAL KNEE ARTHROPLASTY;  Surgeon: Nestor Lewandowsky, MD;  Location[facility];  Service: Orthopedics;  Laterality: Right;  DEPUY/ SIGMA    TOTAL KNEE ARTHROPLASTY  09/22/2011   Procedure: TOTAL KNEE ARTHROPLASTY;  Surgeon: Nestor Lewandowsky, MD;  Location: [facility];  Service: Orthopedics;  Laterality: Left;  DEPUY/SIGMA RP    Short Social History:  Social History   Tobacco Use   Smoking status: Never   Smokeless tobacco: Never  Substance Use Topics   Alcohol use: No    Allergies  Allergen Reactions   Shellfish Allergy Swelling   Penicillins Rash    Current Outpatient Medications  Medication Sig Dispense Refill   allopurinol (ZYLOPRIM) 300 MG tablet Take 300 mg by mouth at bedtime.      amitriptyline (ELAVIL) 100 MG tablet Take  1 tablet (100 mg total) by mouth at bedtime.     aspirin 81 MG tablet Take 81 mg by mouth daily.     atenolol (TENORMIN) 25 MG tablet Take 0.5 tablets (12.5 mg total) by mouth daily. (Patient taking differently: Take 6.25 mg by mouth daily.)     atorvastatin (LIPITOR) 20 MG tablet Take 20 mg by mouth every morning.     Biotin 5 MG CAPS Take 1 capsule by mouth daily.     clonazePAM (KLONOPIN) 0.5 MG tablet Take 0.25 mg by mouth at bedtime as needed. For sleep     CRANBERRY PO Take 4,200 mg by mouth daily.     cyanocobalamin (,VITAMIN B-12,) 1000 MCG/ML injection Inject 1,000 mcg into the muscle every 30 (thirty) days.   4   DULoxetine (CYMBALTA) 30 MG capsule Take 90 mg by mouth at bedtime.      furosemide (LASIX) 80 MG tablet Take 0.5 tablets (40 mg total) by mouth daily. For  swelling.     KLOR-CON M10 10 MEQ tablet Take by mouth 3 (three) times daily.      levalbuterol (XOPENEX) 1.25 MG/0.5ML nebulizer solution Take 1 ampule by nebulization every 4 (four) hours as needed. For shortness of breath/wheezing     levothyroxine (SYNTHROID) 125 MCG tablet Take 125 mcg by mouth daily before breakfast.     Magnesium 500 MG TABS Take 1 capsule by mouth daily.      methocarbamol (ROBAXIN) 500 MG tablet Take 500 mg by mouth every 8 (eight) hours as needed.     Multiple Vitamin (MULITIVITAMIN WITH MINERALS) TABS Take 1 tablet by mouth daily.     nitrofurantoin (MACRODANTIN) 100 MG capsule Take 1 capsule by mouth daily as needed.      nystatin (MYCOSTATIN/NYSTOP) powder Apply 100,000 Units topically 4 (four) times daily.     pantoprazole (PROTONIX) 40 MG tablet Take 40 mg by mouth 2 (two) times daily.     rizatriptan (MAXALT) 10 MG tablet Take 10 mg by mouth as needed. May repeat in 2 hours if needed     Semaglutide (OZEMPIC, 0.25 OR 0.5 MG/DOSE, [place]) Inject into the skin.     candesartan (ATACAND) 8 MG tablet TAKE 1 TABLET BY MOUTH DAILY. HOLD IF SYSTOLIC IS LESS THAN 110 STANDING OR LESS THAN 140 SITTING 90 tablet 1   senna (SENOKOT) 8.6 MG TABS tablet Take 2 tablets (17.2 mg total) by mouth 2 (two) times daily. 30 tablet 0   No current facility-administered medications for this visit.    Review of Systems  Constitutional:  Constitutional negative. HENT: HENT negative.  Eyes: Eyes negative.  Respiratory: Respiratory negative.  Cardiovascular: Cardiovascular negative.  GI: Gastrointestinal negative.  Musculoskeletal: Musculoskeletal negative.  Skin: Skin negative.  Neurological: Neurological negative. Hematologic: Hematologic/lymphatic negative.  Psychiatric: Psychiatric negative.        Objective:  Objective   Vitals:   03/18/23 1027  BP: (!) 137/98  Pulse: 92  Temp: 98.2 F (36.8 C)  SpO2: 96%     Physical Exam HENT:     Head: Normocephalic.      Nose: Nose normal.  Eyes:     Pupils: Pupils are equal, round, and reactive to light.  Cardiovascular:     Rate and Rhythm: Normal rate.     Pulses: Normal pulses.  Pulmonary:     Effort: Pulmonary effort is normal.  Abdominal:     General: Abdomen is flat.     Palpations: Abdomen is soft. There is  no mass.  Musculoskeletal:     Cervical back: Normal range of motion and neck supple.     Right lower leg: No edema.     Left lower leg: No edema.  Skin:    General: Skin is warm.     Capillary Refill: Capillary refill takes less than 2 seconds.  Neurological:     General: No focal deficit present.     Mental Status: She is alert.  Psychiatric:        Mood and Affect: Mood normal.        Thought Content: Thought content normal.        Judgment: Judgment normal.     Data: Duplex Findings:  +--------------------+--------+--------+------+--------+  Mesenteric         PSV cm/sEDV cm/sPlaqueComments  +--------------------+--------+--------+------+--------+  Aorta Prox             68                           +--------------------+--------+--------+------+--------+  Celiac Artery Origin  181                           +--------------------+--------+--------+------+--------+  SMA Proximal          145                           +--------------------+--------+--------+------+--------+  SMA Mid               141                           +--------------------+--------+--------+------+--------+  SMA Distal            166                           +--------------------+--------+--------+------+--------+  CHA                  101      20                   +--------------------+--------+--------+------+--------+  Splenic              126      18                   +--------------------+--------+--------+------+--------+  IMA                  117                           +--------------------+--------+--------+------+--------+            Summary:  Mesenteric:  Normal Celiac artery , Superior Mesenteric artery, Inferior Mesenteric  artery,  Splenic artery and Hepatic artery findings.  No evidence of celiac artery aneursym.      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  I have offered the  patient repeat CT scan if she has concerns but given that we do not identify this on duplex and it was stable in size per CT readings in the past I do not think she needs any further evaluation.  We did discuss that any new back or abdominal pain should warrant evaluation she demonstrates good understanding of this.  Sure that she can see [place] on an as-needed basis.    Maeola Harman MD [facility]

## 2023-11-11 ENCOUNTER — Ambulatory Visit: Payer: Self-pay | Admitting: Podiatry

## 2023-11-11 ENCOUNTER — Ambulatory Visit (INDEPENDENT_AMBULATORY_CARE_PROVIDER_SITE_OTHER)

## 2023-11-11 DIAGNOSIS — M778 Other enthesopathies, not elsewhere classified: Secondary | ICD-10-CM

## 2023-11-11 DIAGNOSIS — M25674 Stiffness of right foot, not elsewhere classified: Secondary | ICD-10-CM

## 2023-11-11 DIAGNOSIS — L84 Corns and callosities: Secondary | ICD-10-CM | POA: Diagnosis not present

## 2023-11-11 NOTE — Progress Notes (Unsigned)
 Chief Complaint  Patient presents with   Foot Pain    Right lateral/midfoot pain. She states it was really bad the other night and she could not lay it on the bed. She also has a callous on the right lateral side at 5th toe. Last A1c is unknown, FBS yesterday was 117, takes ASA    HPI: 63 y.o. female presents today with concern of recent pain along the fifth metatarsal base area on the right foot.  States that since she called to make the appointment, it has started to get better on its own.  She does have some calluses right submet 5 and on the plantar aspect of the right fifth met base.  She did undergo a first MPJ fusion on the right foot several years ago and has had no pain in the joint ever since.  She has orthotics from [facility] [employer] [facility].  Past Medical History:  Diagnosis Date   Anemia    Anxiety    Bronchitis 2013 April    bronchitis ... took a round of clarithromycin  and prednisone x 3 days.    Chronic kidney disease    no NSIADS/anitinflammatory   Depression    Diabetes mellitus without complication (HCC)    Dizziness    Environmental allergies    GERD (gastroesophageal reflux disease)    Gout    Headache(784.0)    Hyperlipemia    Hypertension    Hypothyroidism    Migraines    Peripheral edema    PONV (postoperative nausea and vomiting)    Sleep apnea    uses CPAP nightly   Past Surgical History:  Procedure Laterality Date   ABDOMINAL HYSTERECTOMY     ARTHRODESIS METATARSALPHALANGEAL JOINT (MTPJ) Right 05/19/2019   Procedure: Right Hallux Metatarsal Phalangeal Joint Arthrodesis;  Surgeon: Kit Rush, MD;  Location: [facility];  Service: Orthopedics;  Laterality: Right;   CARPAL TUNNEL RELEASE     rt   CESAREAN SECTION     CHOLECYSTECTOMY     QUADRICEPS REPAIR     lft   ROTATOR CUFF REPAIR     bilateral   SUPERFICIAL PERONEAL NERVE RELEASE Left 08/05/2017   Procedure: SUPERFICIAL PERONEAL NERVE RELEASE AT FIBULAR HEAD;  Surgeon: Liam Lerner,  MD;  Location: [facility];  Service: Orthopedics;  Laterality: Left;   THUMB ARTHROSCOPY  2011   repaired joint .. basal thumb joint   TOTAL KNEE ARTHROPLASTY  08/11/2011   Procedure: TOTAL KNEE ARTHROPLASTY;  Surgeon: Lerner JINNY Liam, MD;  Location: [facility];  Service: Orthopedics;  Laterality: Right;  DEPUY/ SIGMA    TOTAL KNEE ARTHROPLASTY  09/22/2011   Procedure: TOTAL KNEE ARTHROPLASTY;  Surgeon: Lerner JINNY Liam, MD;  Location: [facility];  Service: Orthopedics;  Laterality: Left;  DEPUY/SIGMA RP   Allergies  Allergen Reactions   Shellfish Allergy Swelling   Penicillins Rash     Physical Exam: Palpable pedal pulses.  There is minimal to no pain on palpation at the fifth metatarsal base-cuboid joint.  There is a hyperkeratotic lesion plantar aspect of fifth met base right foot as well as submet 5 right foot.  No surrounding erythema or ecchymosis is noted.  Epicritic sensation is intact.  Restricted range of motion of the first MPJ from prior arthrodesis.  Radiographic Exam (right foot, 3 weightbearing views, 11/11/2023):  Normal osseous mineralization. No fractures noted.  Medial angulation of the fifth toe at the fifth MPJ.  Mild bony prominence on the lateral aspect  of the fifth metatarsal head.  Hardware at the first MPJ fusion site is intact.  Assessment/Plan of Care: 1. Capsulitis of right foot   2. Callus of foot   3. Joint stiffness of toe of right foot     Discussed clinical and radiographic findings with patient today.  The hyperkeratotic lesions were shaved with a sterile #[id number] blade.  A lateral wedge was placed underneath her [employer]-bought insoles to prevent increased pressure along the lateral column.  She will try this out.  She may need a custom orthotic in the future with a lateral wedge applied to decrease pressure along the lateral column during gait.  Follow-up as needed   Awanda CHARM Imperial, DPM, FACFAS [facility]     [address] [place], [place] [address]                Office [phone]  Fax [phone].
